# Patient Record
Sex: Male | Born: 1965 | Race: White | Hispanic: No | Marital: Married | Smoking: Current some day smoker
Health system: Southern US, Community
[De-identification: ages and names within clinical notes are randomized; demographics above are authoritative.]

## PROBLEM LIST (undated history)

## (undated) DIAGNOSIS — L732 Hidradenitis suppurativa: Secondary | ICD-10-CM

## (undated) DIAGNOSIS — E785 Hyperlipidemia, unspecified: Secondary | ICD-10-CM

## (undated) DIAGNOSIS — Z720 Tobacco use: Secondary | ICD-10-CM

## (undated) DIAGNOSIS — Z89619 Acquired absence of unspecified leg above knee: Secondary | ICD-10-CM

## (undated) DIAGNOSIS — I10 Essential (primary) hypertension: Secondary | ICD-10-CM

## (undated) DIAGNOSIS — Z951 Presence of aortocoronary bypass graft: Secondary | ICD-10-CM

## (undated) DIAGNOSIS — R9430 Abnormal result of cardiovascular function study, unspecified: Secondary | ICD-10-CM

## (undated) DIAGNOSIS — I2510 Atherosclerotic heart disease of native coronary artery without angina pectoris: Secondary | ICD-10-CM

## (undated) DIAGNOSIS — R0989 Other specified symptoms and signs involving the circulatory and respiratory systems: Secondary | ICD-10-CM

## (undated) DIAGNOSIS — I739 Peripheral vascular disease, unspecified: Secondary | ICD-10-CM

## (undated) HISTORY — DX: Hyperlipidemia, unspecified: E78.5

## (undated) HISTORY — DX: Abnormal result of cardiovascular function study, unspecified: R94.30

## (undated) HISTORY — DX: Tobacco use: Z72.0

## (undated) HISTORY — DX: Essential (primary) hypertension: I10

## (undated) HISTORY — DX: Other specified symptoms and signs involving the circulatory and respiratory systems: R09.89

## (undated) HISTORY — DX: Presence of aortocoronary bypass graft: Z95.1

## (undated) HISTORY — DX: Acquired absence of unspecified leg above knee: Z89.619

## (undated) HISTORY — DX: Hidradenitis suppurativa: L73.2

## (undated) HISTORY — DX: Peripheral vascular disease, unspecified: I73.9

## (undated) HISTORY — DX: Atherosclerotic heart disease of native coronary artery without angina pectoris: I25.10

## (undated) HISTORY — PX: ABOVE KNEE LEG AMPUTATION: SUR20

## (undated) HISTORY — PX: CORONARY ARTERY BYPASS GRAFT: SHX141

---

## 2001-06-14 ENCOUNTER — Encounter: Payer: Self-pay | Admitting: Internal Medicine

## 2001-06-14 ENCOUNTER — Emergency Department (HOSPITAL_COMMUNITY): Admission: EM | Admit: 2001-06-14 | Discharge: 2001-06-14 | Payer: Self-pay | Admitting: Internal Medicine

## 2001-06-19 ENCOUNTER — Emergency Department (HOSPITAL_COMMUNITY): Admission: EM | Admit: 2001-06-19 | Discharge: 2001-06-19 | Payer: Self-pay | Admitting: Emergency Medicine

## 2006-10-28 ENCOUNTER — Ambulatory Visit: Payer: Self-pay | Admitting: Cardiology

## 2006-10-28 ENCOUNTER — Encounter: Payer: Self-pay | Admitting: Cardiology

## 2006-10-28 ENCOUNTER — Inpatient Hospital Stay (HOSPITAL_COMMUNITY): Admission: EM | Admit: 2006-10-28 | Discharge: 2006-10-30 | Payer: Self-pay | Admitting: Cardiology

## 2006-11-14 ENCOUNTER — Ambulatory Visit: Payer: Self-pay | Admitting: Cardiology

## 2007-04-04 ENCOUNTER — Ambulatory Visit: Payer: Self-pay | Admitting: Cardiology

## 2007-12-03 ENCOUNTER — Encounter: Payer: Self-pay | Admitting: Cardiology

## 2007-12-09 ENCOUNTER — Ambulatory Visit: Payer: Self-pay | Admitting: Cardiology

## 2007-12-15 ENCOUNTER — Ambulatory Visit: Payer: Self-pay | Admitting: Cardiology

## 2007-12-25 ENCOUNTER — Encounter: Payer: Self-pay | Admitting: Cardiology

## 2007-12-29 ENCOUNTER — Ambulatory Visit: Payer: Self-pay | Admitting: Cardiology

## 2007-12-29 ENCOUNTER — Encounter: Payer: Self-pay | Admitting: Cardiology

## 2007-12-30 ENCOUNTER — Inpatient Hospital Stay (HOSPITAL_BASED_OUTPATIENT_CLINIC_OR_DEPARTMENT_OTHER): Admission: RE | Admit: 2007-12-30 | Discharge: 2007-12-30 | Payer: Self-pay | Admitting: Cardiology

## 2007-12-30 ENCOUNTER — Ambulatory Visit: Payer: Self-pay | Admitting: Cardiology

## 2008-01-09 ENCOUNTER — Ambulatory Visit: Payer: Self-pay | Admitting: Cardiology

## 2008-04-12 ENCOUNTER — Ambulatory Visit: Payer: Self-pay | Admitting: Cardiology

## 2008-09-20 ENCOUNTER — Encounter: Payer: Self-pay | Admitting: Cardiology

## 2008-09-21 ENCOUNTER — Ambulatory Visit: Payer: Self-pay | Admitting: Cardiology

## 2009-03-09 ENCOUNTER — Encounter: Payer: Self-pay | Admitting: Cardiology

## 2009-03-10 ENCOUNTER — Ambulatory Visit: Payer: Self-pay | Admitting: Cardiology

## 2009-03-21 ENCOUNTER — Telehealth (INDEPENDENT_AMBULATORY_CARE_PROVIDER_SITE_OTHER): Payer: Self-pay | Admitting: *Deleted

## 2009-09-30 ENCOUNTER — Encounter (INDEPENDENT_AMBULATORY_CARE_PROVIDER_SITE_OTHER): Payer: Self-pay | Admitting: Nurse Practitioner

## 2009-10-10 ENCOUNTER — Ambulatory Visit: Payer: Self-pay | Admitting: Cardiology

## 2009-10-14 ENCOUNTER — Ambulatory Visit: Payer: Self-pay | Admitting: Cardiology

## 2009-10-14 ENCOUNTER — Inpatient Hospital Stay (HOSPITAL_BASED_OUTPATIENT_CLINIC_OR_DEPARTMENT_OTHER): Admission: RE | Admit: 2009-10-14 | Discharge: 2009-10-14 | Payer: Self-pay | Admitting: Cardiology

## 2009-10-17 ENCOUNTER — Ambulatory Visit: Payer: Self-pay | Admitting: Thoracic Surgery (Cardiothoracic Vascular Surgery)

## 2009-10-17 ENCOUNTER — Encounter: Payer: Self-pay | Admitting: Cardiology

## 2009-10-24 ENCOUNTER — Encounter: Payer: Self-pay | Admitting: Physician Assistant

## 2009-10-24 ENCOUNTER — Ambulatory Visit: Payer: Self-pay | Admitting: Thoracic Surgery (Cardiothoracic Vascular Surgery)

## 2009-10-24 ENCOUNTER — Encounter: Payer: Self-pay | Admitting: Thoracic Surgery (Cardiothoracic Vascular Surgery)

## 2009-10-27 ENCOUNTER — Encounter: Payer: Self-pay | Admitting: Physician Assistant

## 2009-10-27 ENCOUNTER — Ambulatory Visit: Payer: Self-pay | Admitting: Thoracic Surgery (Cardiothoracic Vascular Surgery)

## 2009-10-27 ENCOUNTER — Encounter: Payer: Self-pay | Admitting: Thoracic Surgery (Cardiothoracic Vascular Surgery)

## 2009-10-27 ENCOUNTER — Inpatient Hospital Stay (HOSPITAL_COMMUNITY)
Admission: RE | Admit: 2009-10-27 | Discharge: 2009-11-01 | Payer: Self-pay | Admitting: Thoracic Surgery (Cardiothoracic Vascular Surgery)

## 2009-10-28 ENCOUNTER — Encounter: Payer: Self-pay | Admitting: Physician Assistant

## 2009-10-31 ENCOUNTER — Encounter: Payer: Self-pay | Admitting: Physician Assistant

## 2009-11-01 ENCOUNTER — Encounter: Payer: Self-pay | Admitting: Physician Assistant

## 2009-11-10 ENCOUNTER — Telehealth: Payer: Self-pay | Admitting: Cardiology

## 2009-11-21 ENCOUNTER — Encounter: Payer: Self-pay | Admitting: Physician Assistant

## 2009-11-21 ENCOUNTER — Ambulatory Visit: Payer: Self-pay | Admitting: Thoracic Surgery (Cardiothoracic Vascular Surgery)

## 2009-11-21 ENCOUNTER — Encounter
Admission: RE | Admit: 2009-11-21 | Discharge: 2009-11-21 | Payer: Self-pay | Admitting: Thoracic Surgery (Cardiothoracic Vascular Surgery)

## 2009-11-28 ENCOUNTER — Ambulatory Visit: Payer: Self-pay | Admitting: Physician Assistant

## 2009-12-22 ENCOUNTER — Telehealth: Payer: Self-pay | Admitting: Physician Assistant

## 2009-12-23 ENCOUNTER — Ambulatory Visit: Payer: Self-pay | Admitting: Physician Assistant

## 2009-12-30 ENCOUNTER — Encounter: Payer: Self-pay | Admitting: Physician Assistant

## 2009-12-30 ENCOUNTER — Inpatient Hospital Stay (HOSPITAL_COMMUNITY)
Admission: EM | Admit: 2009-12-30 | Discharge: 2010-01-05 | Payer: Self-pay | Source: Home / Self Care | Attending: Cardiology | Admitting: Cardiology

## 2010-01-04 ENCOUNTER — Encounter: Payer: Self-pay | Admitting: Physician Assistant

## 2010-01-04 LAB — BASIC METABOLIC PANEL
BUN: 12 mg/dL (ref 6–23)
CO2: 27 mEq/L (ref 19–32)
Calcium: 9 mg/dL (ref 8.4–10.5)
Chloride: 106 mEq/L (ref 96–112)
Creatinine, Ser: 0.93 mg/dL (ref 0.4–1.5)
GFR calc Af Amer: >60 mL/min (ref >60)
GFR calc non Af Amer: >60 mL/min (ref >60)
Glucose, Bld: 114 mg/dL — ABNORMAL HIGH (ref 70–99)
Potassium: 4.1 mEq/L (ref 3.5–5.1)
Sodium: 138 mEq/L (ref 135–145)

## 2010-01-05 ENCOUNTER — Encounter: Payer: Self-pay | Admitting: Physician Assistant

## 2010-01-05 LAB — CBC
HCT: 46.4 % (ref 39.0–52.0)
Hemoglobin: 15.4 g/dL (ref 13.0–17.0)
MCH: 32 pg (ref 26.0–34.0)
MCHC: 33.2 g/dL (ref 30.0–36.0)
MCV: 96.3 fL (ref 78.0–100.0)
Platelets: 167 10*3/uL (ref 150–400)
RBC: 4.82 MIL/uL (ref 4.22–5.81)
RDW: 13.3 % (ref 11.5–15.5)
WBC: 5.7 10*3/uL (ref 4.0–10.5)

## 2010-01-05 LAB — BASIC METABOLIC PANEL
BUN: 10 mg/dL (ref 6–23)
CO2: 26 mEq/L (ref 19–32)
Calcium: 8.7 mg/dL (ref 8.4–10.5)
Chloride: 108 mEq/L (ref 96–112)
Creatinine, Ser: 1.01 mg/dL (ref 0.4–1.5)
GFR calc Af Amer: >60 mL/min (ref >60)
GFR calc non Af Amer: >60 mL/min (ref >60)
Glucose, Bld: 135 mg/dL — ABNORMAL HIGH (ref 70–99)
Potassium: 4.1 mEq/L (ref 3.5–5.1)
Sodium: 138 mEq/L (ref 135–145)

## 2010-01-25 ENCOUNTER — Encounter: Payer: Self-pay | Admitting: Physician Assistant

## 2010-01-25 ENCOUNTER — Ambulatory Visit
Admission: RE | Admit: 2010-01-25 | Discharge: 2010-01-25 | Payer: Self-pay | Source: Home / Self Care | Attending: Physician Assistant | Admitting: Physician Assistant

## 2010-01-27 NOTE — Procedures (Signed)
NAME:  [name], [name]                ACCOUNT NO.:  [id number]  MEDICAL RECORD NO.:  [id number]           PATIENT TYPE:  LOCATION:                                 FACILITY:  PHYSICIAN:  Lorine Bears, MD     DATE OF BIRTH:  1965/08/11  DATE OF PROCEDURE: DATE OF DISCHARGE:                           CARDIAC CATHETERIZATION   PROCEDURES PERFORMED: 1. Left heart catheterization. 2. Coronary angiography. 3. Left ventricular angiography. 4. LIMA angiography. 5. SVG angiography. 6. OM-2 angioplasty and drug-eluting stent placement.  INDICATIONS AND CLINICAL HISTORY:  [name] is a 45 year old gentleman with known history of coronary artery disease status post previous angioplasty and stent placement to the mid circumflex.  He ultimately underwent coronary artery bypass graft surgery in October 2011.  He presented to the hospital with recurrent symptoms of chest and arm discomfort similar to his previous angina.  He ruled out for myocardial infarction.  Due to his previous history of coronary artery disease and continued symptoms, coronary angiography was recommended.  Risks, benefits, and alternatives were discussed with the patient.  STUDY DETAILS:  Standard informed consent was obtained.  The left groin area was prepped in a sterile fashion.  A 5-French sheath was placed in the left femoral artery.  The femoral pulse was weak overall.  A coronary angiography was performed with a JL-4, JR-4, and a pigtail catheter.  I long exchange wire and LIMA catheters were used to perform LIMA angiography.  Left ventricular angiography was performed in the RAO 30 position.  At the end of the procedure, it was decided with an interventional procedure to OM-2.  INTERVENTIONAL PROCEDURE:  The 5-French sheath was exchanged to a 6- [place] sheath.  An XB 3.5 guiding catheter with side holes was used. The intuition wire crossed the lesion easily.  Predilation was performed with 2.25- x 12-mm Apex  balloon to 8 atmospheres and then 10 atmospheres.  I then placed a 2.5- x 20-mm Promus Element stent and deployed at 11 atmospheres.  This was postdilated proximally with a 3.0- x 12-mm noncompliant balloon to 12 atmospheres.  Final angiography showed very good results.  The wire and catheters were removed.  Sheath was sutured in place.  The patient tolerated the procedure well with no immediate complications.  STUDY FINDINGS: 1. Hemodynamic findings:  Aortic pressure was 141/27 with a mean     pressure of 95 mmHg.  Left ventricular pressure was 147/14 with a     left ventricular end-diastolic pressure of 22 mmHg. 2. Left ventricular angiography:  This showed an ejection fraction of     50%. 3. Coronary angiography:     a.     Left main coronary artery:  The vessel was normal in size      and free of significant disease.     b.     Left circumflex artery:  There was a 40% diffuse ostial      stenosis.  It gives a very small OM-1.  In the mid segment right      after giving OM-2, there was a 70% stenosis proximal to previously  placed stent.  This supplies in the OM territory which is supplied      by a patent graft.  OM-2 has a 70% proximal stenosis and overall      is medium-sized vessel.     c.     Left anterior descending artery:  The vessel was normal in      size.  It was occluded in the mid segment after giving first      diagonal as well as septal branch.  The first diagonal has mild      atherosclerosis with no obstructive lesions.     d.     Right coronary artery:  The vessel was occluded in the mid      segment after giving a large-sized RV branch.     e.     SVG to right PDA:  The graft was patent with 30% to 40%      proximal stenosis but no obstructive disease overall.  It connects      to PDA which has some diffuse nonobstructive disease.  The PDA in      the proximal segment is diffusely diseased, and thus there is      probably limited flow to the posterolateral  branches which are      overall medium in size.     f.     SVG to second diagonal:  The graft was patent.  There was      20% ostial stenosis and 30% proximal stenosis.  Overall, quality      of the graft is good.     g.     SVG to OM-3 is patent with 20% ostial stenosis and no other      obstructive disease.     h.     LIMA to LAD is patent with 10% to 20% anastomosis lesion.      The LAD is normal after the anastomosis.  IMPRESSION: 1. Severe native three-vessel coronary artery disease with patent     grafts. 2. Low normal left ventricular systolic function. 3. Significant disease in the proximal obtuse marginal-2, which was     not bypassed. 4. Successful angioplasty and drug-eluting stent placement to the     obtuse marginal-2 with a 2.5- x 20-mm Promus Element stent. 5. There is significant disease in proximal posterior descending     artery and thus the posterolateral branches likely have compromised     flow.  However, angioplasty in that location through the SVG is technically difficult and      not recommended at this time.   RECOMMENDATIONS: 1. Continue aspirin daily indefinitely and Plavix 75 mg once daily for     at least 12 months. 2. Continue aggressive medical therapy.     Lorine Bears, MD   ______________________________ Lorine Bears, MD    MA/MEDQ  D:  01/04/2010  T:  01/05/2010  Job:  [id number]  Electronically Signed by Lorine Bears MD on 01/25/2010 10:28:07 AM
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
## 2010-01-31 NOTE — Letter (Signed)
Summary: [facility]   [facility]   Imported By: Roderic Ovens 11/09/2009 16:19:50  _____________________________________________________________________  External Attachment:    Type:   Image     Comment:   External Document
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
## 2010-01-31 NOTE — Letter (Signed)
Summary: Internal Correspondence/ FAXED PRE-CATH ORDER  Internal Correspondence/ FAXED PRE-CATH ORDER   Imported By: Dorise Hiss 10/24/2009 15:30:32  _____________________________________________________________________  External Attachment:    Type:   Image     Comment:   External Document

## 2010-01-31 NOTE — Miscellaneous (Signed)
Summary: Rehab Report/ FAXED  CARDIAC REHAB REFERRAL  Rehab Report/ FAXED  CARDIAC REHAB REFERRAL   Imported By: Dorise Hiss 12/06/2009 15:43:34  _____________________________________________________________________  External Attachment:    Type:   Image     Comment:   External Document

## 2010-01-31 NOTE — Progress Notes (Signed)
Summary: wants something for stress  Phone Note Call from Patient Call back at [phone]   Summary of Call: Pt called asking for something for stress. He states Hospice has been called in on his mother and his wife is at [facility] having hip surgery. Notified pt his primary MD is the more appropriate provider for stress medications. Pt verbalized understanding.  Initial call taken by: Cyril Loosen, RN, BSN,  March 21, 2009 4:36 PM

## 2010-01-31 NOTE — Letter (Signed)
Summary: Discharge Summary  Discharge Summary   Imported By: Claudette Laws 11/02/2009 12:36:45  _____________________________________________________________________  External Attachment:    Type:   Image     Comment:   External Document

## 2010-01-31 NOTE — Progress Notes (Signed)
Summary: medication question  Phone Note Other Incoming   Caller: Lisbeth Renshaw [phone] opt [phone] Summary of Call: Calling regarding medication Statin Initial call taken by: Judie Grieve,  November 10, 2009 10:51 AM  Follow-up for Phone Call        Attempted to return call. Initially was told they could not find why we were called. Then [employer] employee asked if pt was present to give permission for me to speak with her. When I notified her that I was returning their call, she stated "I understand that but I mean anybody could call." Notified her that pt was not present and that I was simply trying to return their call. They can call back if they need anything further. Follow-up by: Cyril Loosen, RN, BSN,  November 10, 2009 11:41 AM
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
## 2010-02-02 NOTE — Medication Information (Signed)
Summary: [facility]  [facility] [employer] [facility]   Imported By: Arta Bruce 12/14/2009 09:24:11  _____________________________________________________________________  External Attachment:    Type:   Image     Comment:   External Document

## 2010-02-02 NOTE — Progress Notes (Signed)
Summary: Shoulder/Arm Pain w/Chest Burning  Phone Note Call from Patient Call back at Home Phone [phone]   Summary of Call: Pt's wife called stating for about 1 week pt is having (L) shoulder pain that goes down his arm. He has burning in his chest when he has the arm pain. She states the pain is not with exertion alone and it comes and goes. Pt has NTG but hasn't taken this b/c he was told following CABG that he wouldn't need it anymore.  Discussed with Gene Serpe, PA-C. Pt will come into the office in the morning for EKG and VS. Pt's wife will notify him of this plan and let us know if he is unable to come in the morning.  Initial call taken by: Cyril Loosen, RN, BSN,  December 22, 2009 4:26 PM     Appended Document: Shoulder/Arm Pain w/Chest Burning Pt did not come for office visit and did not call office to as requested. Pt has no visits showing in ER at [facility] or [facility]. Attempted to reach pt to check on his status but no answer/voicemail.   Appended Document: Shoulder/Arm Pain w/Chest Burning Pt's wife called stating pt would like to be seen. She states he didn't come last week b/c he was hoping the pain would get better. She states per pt, pt is actually worse than it was last week. Pt's wife notified pt should go to ER for evaluation of worsening chest pain. Pt's wife verbalized understanding and states she will have pt go to ER for eval today.

## 2010-02-02 NOTE — Cardiovascular Report (Signed)
Summary: Cardiac Catheterization  Cardiac Catheterization   Imported By: Dorise Hiss 01/24/2010 14:30:37  _____________________________________________________________________  External Attachment:    Type:   Image     Comment:   External Document

## 2010-02-03 ENCOUNTER — Encounter: Payer: Self-pay | Admitting: Physician Assistant

## 2010-02-07 ENCOUNTER — Encounter (INDEPENDENT_AMBULATORY_CARE_PROVIDER_SITE_OTHER): Payer: Self-pay | Admitting: *Deleted

## 2010-02-16 NOTE — Letter (Signed)
Summary: Engineer, materials at [facility]  [address]   [place], [place] [address]   Phone: [phone]  Fax: [phone]        February 07, 2010 MRN: [id number]    [facility] [address] [place], [place]  [address]    Dear Mr. Babic,  Your test ordered by Selena Batten has been reviewed by your physician (or physician assistant) and was found to be normal or stable. Your physician (or physician assistant) felt no changes were needed at this time.  ____ Echocardiogram  ____ Cardiac Stress Test  __X__ Lab Work  ____ Peripheral vascular study of arms, legs or neck  ____ CT scan or X-ray  ____ Lung or Breathing test  ____ Other:   Thank you.   Cyril Loosen, RN, BSN    Duane Boston, M.D., F.A.C.C. Thressa Sheller, M.D., F.A.C.C. Oneal Grout, M.D., F.A.C.C. Cheree Ditto, M.D., F.A.C.C. Daiva Nakayama, M.D., F.A.C.C. Kenney Houseman, M.D., F.A.C.C. Jeanne Ivan, PA-C

## 2010-03-13 LAB — BASIC METABOLIC PANEL
BUN: 11 mg/dL (ref 6–23)
CO2: 27 mEq/L (ref 19–32)
Calcium: 9 mg/dL (ref 8.4–10.5)
Calcium: 9.6 mg/dL (ref 8.4–10.5)
Chloride: 106 mEq/L (ref 96–112)
Creatinine, Ser: 0.85 mg/dL (ref 0.4–1.5)
Creatinine, Ser: 0.99 mg/dL (ref 0.4–1.5)
GFR calc Af Amer: >60 mL/min (ref >60)
GFR calc Af Amer: >60 mL/min (ref >60)
GFR calc non Af Amer: >60 mL/min (ref >60)
GFR calc non Af Amer: >60 mL/min (ref >60)
Glucose, Bld: 159 mg/dL — ABNORMAL HIGH (ref 70–99)
Potassium: 3.7 mEq/L (ref 3.5–5.1)
Sodium: 138 mEq/L (ref 135–145)
Sodium: 140 mEq/L (ref 135–145)

## 2010-03-13 LAB — CARDIAC PANEL(CRET KIN+CKTOT+MB+TROPI)
CK, MB: 0.6 ng/mL (ref 0.3–4.0)
CK, MB: 0.6 ng/mL (ref 0.3–4.0)
Relative Index: INVALID (ref 0.0–2.5)
Total CK: 31 U/L (ref 7–232)
Troponin I: 0.02 ng/mL (ref 0.00–0.06)
Troponin I: 0.03 ng/mL (ref 0.00–0.06)

## 2010-03-13 LAB — DIFFERENTIAL
Lymphocytes Relative: 31 % (ref 12–46)
Lymphs Abs: 1.7 10*3/uL (ref 0.7–4.0)
Monocytes Absolute: 0.4 10*3/uL (ref 0.1–1.0)
Monocytes Relative: 7 % (ref 3–12)
Neutro Abs: 3.3 10*3/uL (ref 1.7–7.7)

## 2010-03-13 LAB — CBC
HCT: 46 % (ref 39.0–52.0)
Hemoglobin: 15.1 g/dL (ref 13.0–17.0)
Hemoglobin: 15.8 g/dL (ref 13.0–17.0)
MCH: 32.4 pg (ref 26.0–34.0)
MCHC: 33.2 g/dL (ref 30.0–36.0)
Platelets: 194 10*3/uL (ref 150–400)
RBC: 4.88 MIL/uL (ref 4.22–5.81)
RDW: 13.3 % (ref 11.5–15.5)
WBC: 5.5 10*3/uL (ref 4.0–10.5)

## 2010-03-13 LAB — POCT CARDIAC MARKERS
CKMB, poc: <1 ng/mL — ABNORMAL LOW (ref 1.0–8.0)
Troponin i, poc: <0.05 ng/mL (ref 0.00–0.09)
Troponin i, poc: <0.05 ng/mL (ref 0.00–0.09)

## 2010-03-13 LAB — CK TOTAL AND CKMB (NOT AT ARMC)
CK, MB: 0.6 ng/mL (ref 0.3–4.0)
CK, MB: 0.7 ng/mL (ref 0.3–4.0)
Relative Index: INVALID (ref 0.0–2.5)
Total CK: 22 U/L (ref 7–232)

## 2010-03-13 LAB — LIPID PANEL
Cholesterol: 201 mg/dL — ABNORMAL HIGH (ref 0–200)
Total CHOL/HDL Ratio: 6.9 RATIO

## 2010-03-13 LAB — BRAIN NATRIURETIC PEPTIDE: Pro B Natriuretic peptide (BNP): 84 pg/mL (ref 0.0–100.0)

## 2010-03-13 LAB — TROPONIN I
Troponin I: 0.03 ng/mL (ref 0.00–0.06)
Troponin I: 0.03 ng/mL (ref 0.00–0.06)

## 2010-03-13 LAB — MAGNESIUM: Magnesium: 2 mg/dL (ref 1.5–2.5)

## 2010-03-15 LAB — CBC
HCT: 40.7 % (ref 39.0–52.0)
HCT: 40.8 % (ref 39.0–52.0)
HCT: 41.9 % (ref 39.0–52.0)
HCT: 42.5 % (ref 39.0–52.0)
HCT: 43.2 % (ref 39.0–52.0)
HCT: 43.4 % (ref 39.0–52.0)
Hemoglobin: 13.5 g/dL (ref 13.0–17.0)
Hemoglobin: 13.5 g/dL (ref 13.0–17.0)
Hemoglobin: 13.5 g/dL (ref 13.0–17.0)
Hemoglobin: 13.9 g/dL (ref 13.0–17.0)
Hemoglobin: 14.3 g/dL (ref 13.0–17.0)
Hemoglobin: 14.4 g/dL (ref 13.0–17.0)
Hemoglobin: 14.7 g/dL (ref 13.0–17.0)
MCH: 32 pg (ref 26.0–34.0)
MCH: 32 pg (ref 26.0–34.0)
MCH: 32.5 pg (ref 26.0–34.0)
MCH: 32.7 pg (ref 26.0–34.0)
MCH: 33 pg (ref 26.0–34.0)
MCHC: 32.2 g/dL (ref 30.0–36.0)
MCHC: 32.7 g/dL (ref 30.0–36.0)
MCHC: 32.9 g/dL (ref 30.0–36.0)
MCHC: 33 g/dL (ref 30.0–36.0)
MCHC: 33.1 g/dL (ref 30.0–36.0)
MCHC: 33.2 g/dL (ref 30.0–36.0)
MCHC: 33.3 g/dL (ref 30.0–36.0)
MCHC: 33.4 g/dL (ref 30.0–36.0)
MCHC: 34.5 g/dL (ref 30.0–36.0)
MCV: 95 fL (ref 78.0–100.0)
MCV: 97.7 fL (ref 78.0–100.0)
MCV: 98.3 fL (ref 78.0–100.0)
MCV: 98.5 fL (ref 78.0–100.0)
MCV: 99.3 fL (ref 78.0–100.0)
Platelets: 106 10*3/uL — ABNORMAL LOW (ref 150–400)
Platelets: 108 10*3/uL — ABNORMAL LOW (ref 150–400)
Platelets: 111 10*3/uL — ABNORMAL LOW (ref 150–400)
Platelets: 128 10*3/uL — ABNORMAL LOW (ref 150–400)
Platelets: 173 10*3/uL (ref 150–400)
Platelets: 98 10*3/uL — ABNORMAL LOW (ref 150–400)
RBC: 4.13 MIL/uL — ABNORMAL LOW (ref 4.22–5.81)
RBC: 4.15 MIL/uL — ABNORMAL LOW (ref 4.22–5.81)
RBC: 4.22 MIL/uL (ref 4.22–5.81)
RBC: 4.35 MIL/uL (ref 4.22–5.81)
RBC: 4.36 MIL/uL (ref 4.22–5.81)
RDW: 12.3 % (ref 11.5–15.5)
RDW: 12.5 % (ref 11.5–15.5)
RDW: 12.5 % (ref 11.5–15.5)
RDW: 12.6 % (ref 11.5–15.5)
RDW: 12.6 % (ref 11.5–15.5)
RDW: 12.7 % (ref 11.5–15.5)
RDW: 12.8 % (ref 11.5–15.5)
WBC: 11 10*3/uL — ABNORMAL HIGH (ref 4.0–10.5)
WBC: 11.8 10*3/uL — ABNORMAL HIGH (ref 4.0–10.5)
WBC: 11.9 10*3/uL — ABNORMAL HIGH (ref 4.0–10.5)
WBC: 6.5 10*3/uL (ref 4.0–10.5)
WBC: 6.7 10*3/uL (ref 4.0–10.5)
WBC: 8.3 10*3/uL (ref 4.0–10.5)

## 2010-03-15 LAB — BASIC METABOLIC PANEL
BUN: 10 mg/dL (ref 6–23)
BUN: 13 mg/dL (ref 6–23)
BUN: 15 mg/dL (ref 6–23)
BUN: 19 mg/dL (ref 6–23)
BUN: 22 mg/dL (ref 6–23)
CO2: 27 mEq/L (ref 19–32)
CO2: 27 mEq/L (ref 19–32)
CO2: 29 mEq/L (ref 19–32)
CO2: 31 mEq/L (ref 19–32)
Calcium: 8.1 mg/dL — ABNORMAL LOW (ref 8.4–10.5)
Calcium: 8.3 mg/dL — ABNORMAL LOW (ref 8.4–10.5)
Calcium: 8.5 mg/dL (ref 8.4–10.5)
Calcium: 8.6 mg/dL (ref 8.4–10.5)
Calcium: 8.6 mg/dL (ref 8.4–10.5)
Chloride: 100 mEq/L (ref 96–112)
Chloride: 101 mEq/L (ref 96–112)
Chloride: 101 mEq/L (ref 96–112)
Chloride: 103 mEq/L (ref 96–112)
Creatinine, Ser: 0.73 mg/dL (ref 0.4–1.5)
Creatinine, Ser: 0.78 mg/dL (ref 0.4–1.5)
Creatinine, Ser: 0.82 mg/dL (ref 0.4–1.5)
Creatinine, Ser: 0.86 mg/dL (ref 0.4–1.5)
Creatinine, Ser: 1 mg/dL (ref 0.4–1.5)
GFR calc Af Amer: >60 mL/min (ref >60)
GFR calc Af Amer: >60 mL/min (ref >60)
GFR calc Af Amer: >60 mL/min (ref >60)
GFR calc Af Amer: >60 mL/min (ref >60)
GFR calc non Af Amer: >60 mL/min (ref >60)
GFR calc non Af Amer: >60 mL/min (ref >60)
GFR calc non Af Amer: >60 mL/min (ref >60)
GFR calc non Af Amer: >60 mL/min (ref >60)
GFR calc non Af Amer: >60 mL/min (ref >60)
Glucose, Bld: 119 mg/dL — ABNORMAL HIGH (ref 70–99)
Glucose, Bld: 120 mg/dL — ABNORMAL HIGH (ref 70–99)
Glucose, Bld: 124 mg/dL — ABNORMAL HIGH (ref 70–99)
Glucose, Bld: 129 mg/dL — ABNORMAL HIGH (ref 70–99)
Glucose, Bld: 162 mg/dL — ABNORMAL HIGH (ref 70–99)
Potassium: 3.5 mEq/L (ref 3.5–5.1)
Potassium: 3.7 mEq/L (ref 3.5–5.1)
Potassium: 3.8 mEq/L (ref 3.5–5.1)
Potassium: 3.9 mEq/L (ref 3.5–5.1)
Sodium: 133 mEq/L — ABNORMAL LOW (ref 135–145)
Sodium: 134 mEq/L — ABNORMAL LOW (ref 135–145)
Sodium: 137 mEq/L (ref 135–145)
Sodium: 137 mEq/L (ref 135–145)
Sodium: 138 mEq/L (ref 135–145)

## 2010-03-15 LAB — URINALYSIS, ROUTINE W REFLEX MICROSCOPIC
Glucose, UA: NEGATIVE mg/dL
Hgb urine dipstick: NEGATIVE
Ketones, ur: NEGATIVE mg/dL
Protein, ur: NEGATIVE mg/dL
Urobilinogen, UA: 0.2 mg/dL (ref 0.0–1.0)

## 2010-03-15 LAB — POCT I-STAT, CHEM 8
Calcium, Ion: 1.14 mmol/L (ref 1.12–1.32)
Chloride: 100 mEq/L (ref 96–112)
Creatinine, Ser: 0.8 mg/dL (ref 0.4–1.5)
Creatinine, Ser: 1.1 mg/dL (ref 0.4–1.5)
Glucose, Bld: 160 mg/dL — ABNORMAL HIGH (ref 70–99)
Glucose, Bld: 95 mg/dL (ref 70–99)
HCT: 44 % (ref 39.0–52.0)
HCT: 46 % (ref 39.0–52.0)
Hemoglobin: 15 g/dL (ref 13.0–17.0)
Hemoglobin: 15.6 g/dL (ref 13.0–17.0)
Potassium: 3.8 mEq/L (ref 3.5–5.1)
Potassium: 4.4 mEq/L (ref 3.5–5.1)
Sodium: 140 mEq/L (ref 135–145)

## 2010-03-15 LAB — PROTIME-INR
INR: 0.98 (ref 0.00–1.49)
INR: 1.25 (ref 0.00–1.49)
Prothrombin Time: 13.2 seconds (ref 11.6–15.2)
Prothrombin Time: 15.9 seconds — ABNORMAL HIGH (ref 11.6–15.2)

## 2010-03-15 LAB — GLUCOSE, CAPILLARY
Glucose-Capillary: 115 mg/dL — ABNORMAL HIGH (ref 70–99)
Glucose-Capillary: 115 mg/dL — ABNORMAL HIGH (ref 70–99)
Glucose-Capillary: 119 mg/dL — ABNORMAL HIGH (ref 70–99)
Glucose-Capillary: 127 mg/dL — ABNORMAL HIGH (ref 70–99)
Glucose-Capillary: 127 mg/dL — ABNORMAL HIGH (ref 70–99)
Glucose-Capillary: 128 mg/dL — ABNORMAL HIGH (ref 70–99)
Glucose-Capillary: 130 mg/dL — ABNORMAL HIGH (ref 70–99)
Glucose-Capillary: 131 mg/dL — ABNORMAL HIGH (ref 70–99)
Glucose-Capillary: 139 mg/dL — ABNORMAL HIGH (ref 70–99)
Glucose-Capillary: 140 mg/dL — ABNORMAL HIGH (ref 70–99)
Glucose-Capillary: 150 mg/dL — ABNORMAL HIGH (ref 70–99)
Glucose-Capillary: 159 mg/dL — ABNORMAL HIGH (ref 70–99)
Glucose-Capillary: 168 mg/dL — ABNORMAL HIGH (ref 70–99)

## 2010-03-15 LAB — SURGICAL PCR SCREEN
MRSA, PCR: NEGATIVE
Staphylococcus aureus: NEGATIVE

## 2010-03-15 LAB — POCT I-STAT 3, ART BLOOD GAS (G3+)
Acid-base deficit: 4 mmol/L — ABNORMAL HIGH (ref 0.0–2.0)
Acid-base deficit: 5 mmol/L — ABNORMAL HIGH (ref 0.0–2.0)
Acid-base deficit: 6 mmol/L — ABNORMAL HIGH (ref 0.0–2.0)
Bicarbonate: 20.8 mEq/L (ref 20.0–24.0)
Bicarbonate: 22.8 mEq/L (ref 20.0–24.0)
O2 Saturation: 94 %
Patient temperature: 36
Patient temperature: 37.2
Patient temperature: 38
TCO2: 22 mmol/L (ref 0–100)
TCO2: 24 mmol/L (ref 0–100)
TCO2: 24 mmol/L (ref 0–100)
pCO2 arterial: 44.2 mmHg (ref 35.0–45.0)
pH, Arterial: 7.321 — ABNORMAL LOW (ref 7.350–7.450)
pH, Arterial: 7.329 — ABNORMAL LOW (ref 7.350–7.450)
pO2, Arterial: 266 mmHg — ABNORMAL HIGH (ref 80.0–100.0)
pO2, Arterial: 75 mmHg — ABNORMAL LOW (ref 80.0–100.0)

## 2010-03-15 LAB — HEMOGLOBIN AND HEMATOCRIT, BLOOD
HCT: 36.9 % — ABNORMAL LOW (ref 39.0–52.0)
Hemoglobin: 12.3 g/dL — ABNORMAL LOW (ref 13.0–17.0)

## 2010-03-15 LAB — POCT I-STAT 4, (NA,K, GLUC, HGB,HCT)
Glucose, Bld: 126 mg/dL — ABNORMAL HIGH (ref 70–99)
HCT: 36 % — ABNORMAL LOW (ref 39.0–52.0)
HCT: 44 % (ref 39.0–52.0)
Hemoglobin: 11.9 g/dL — ABNORMAL LOW (ref 13.0–17.0)
Hemoglobin: 12.2 g/dL — ABNORMAL LOW (ref 13.0–17.0)
Potassium: 4 mEq/L (ref 3.5–5.1)
Potassium: 4.4 mEq/L (ref 3.5–5.1)
Potassium: 4.4 mEq/L (ref 3.5–5.1)
Sodium: 136 mEq/L (ref 135–145)
Sodium: 138 mEq/L (ref 135–145)

## 2010-03-15 LAB — POCT I-STAT GLUCOSE
Glucose, Bld: 129 mg/dL — ABNORMAL HIGH (ref 70–99)
Operator id: 238831

## 2010-03-15 LAB — COMPREHENSIVE METABOLIC PANEL
Albumin: 4 g/dL (ref 3.5–5.2)
Alkaline Phosphatase: 90 U/L (ref 39–117)
BUN: 15 mg/dL (ref 6–23)
Calcium: 9.7 mg/dL (ref 8.4–10.5)
Glucose, Bld: 117 mg/dL — ABNORMAL HIGH (ref 70–99)
Potassium: 4.1 mEq/L (ref 3.5–5.1)
Sodium: 136 mEq/L (ref 135–145)
Total Protein: 7.7 g/dL (ref 6.0–8.3)

## 2010-03-15 LAB — BLOOD GAS, ARTERIAL
Drawn by: 206361
Patient temperature: 98.6
pCO2 arterial: 36.7 mmHg (ref 35.0–45.0)
pH, Arterial: 7.414 (ref 7.350–7.450)

## 2010-03-15 LAB — TYPE AND SCREEN: Antibody Screen: NEGATIVE

## 2010-03-15 LAB — CREATININE, SERUM
Creatinine, Ser: 0.85 mg/dL (ref 0.4–1.5)
Creatinine, Ser: 1.02 mg/dL (ref 0.4–1.5)

## 2010-03-15 LAB — MAGNESIUM
Magnesium: 2.3 mg/dL (ref 1.5–2.5)
Magnesium: 2.4 mg/dL (ref 1.5–2.5)

## 2010-03-15 LAB — PLATELET COUNT: Platelets: 146 10*3/uL — ABNORMAL LOW (ref 150–400)

## 2010-03-15 LAB — URINE MICROSCOPIC-ADD ON

## 2010-03-15 LAB — HEMOGLOBIN A1C
Hgb A1c MFr Bld: 5.5 % (ref <5.7)
Mean Plasma Glucose: 111 mg/dL (ref <117)

## 2010-03-15 LAB — ABO/RH: ABO/RH(D): A POS

## 2010-05-04 ENCOUNTER — Other Ambulatory Visit: Payer: Self-pay | Admitting: Cardiology
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
## 2010-05-16 NOTE — Assessment & Plan Note (Signed)
OFFICE VISIT   [name], [name]  DOB:  1965-12-27                                        October 24, 2009  CHART #:  [id number]   HISTORY OF PRESENT ILLNESS:  The patient returns for followup with plans  to proceed with coronary artery bypass grafting tomorrow.  He was  originally seen in consultation last week.  He has been off of Plavix  since last week.  He underwent routine preoperative screening today.  He  does note that he developed a new boil located just above his left hip  on his back related to his chronic skin infection.  He has not had any  fevers or chills.  His preoperative lab work are pending at this time.  He otherwise feels well, but he does report that he has continued to  require nitroglycerin on a frequent basis for recurrent angina pectoris.   PHYSICAL EXAMINATION:  Notable for well-appearing male with blood  pressure 145/82, pulse 61, and oxygen saturation 97%.  He is afebrile.  Auscultation of the chest reveals clear breath sounds.  The anterior  chest is clear and there is no sign of skin infection there.  He does  have a boil that has spontaneously drained just above his hip on the  left back.  There is some mild surrounding cellulitis.  No other areas  of concern are noted.  There is one area that is healing on his buttocks  and no other active skin lesions are noted.  The remainder of his  physical exam is unchanged from previously.   IMPRESSION:  Severe three-vessel coronary artery disease with stable  angina pectoris.  The patient needs coronary artery bypass grafting.  He  has chronic skin infection with a single boil on his left back, it is  typical for his chronic problem.  Options include delayed surgery for a  preoperative course of antibiotics versus starting antibiotics and  proceeding with surgery.  I have discussed matters over the telephone  with his dermatologist, Dr. Philis Kendall at [facility].  She is  recommended that we stop his Enbrel at the time of surgery and treat him  with a course of oral doxycycline.  We will go ahead and start  doxycycline today and plan with surgery on Wednesday as previously  scheduled.  All of his questions have been addressed.  The  patient understands that he may be at slightly increased risk for  postoperative wound infection.  All of his questions have been answered.   Salvatore Decent. Cornelius Moras, M.D.  Electronically Signed   CHO/MEDQ  D:  10/24/2009  T:  10/25/2009  Job:  [id number]
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
## 2010-05-16 NOTE — Discharge Summary (Signed)
NAMEBRENDON, [name]                ACCOUNT NO.:  [id number]   MEDICAL RECORD NO.:  [id number]          PATIENT TYPE:  INP   LOCATION:  2926                         FACILITY:  [facility]   PHYSICIAN:  Veverly Fells. Excell Seltzer, MD  DATE OF BIRTH:  February 10, 1965   DATE OF ADMISSION:  10/28/2006  DATE OF DISCHARGE:  10/30/2006                         DISCHARGE SUMMARY - REFERRING   DISCHARGE DIAGNOSES:  1. Acute inferolateral myocardial infarction with transfer from      [facility].  2. Three-vessel coronary artery disease.  3. Drug-eluting stent x2 to the circumflex with residual disease      treated by medical therapy.  4. Hypertension.  5. Asymptomatic nonsustained ventricular tachycardia.  6. Tobacco use.  7. Hyperlipidemia.  8. History as noted below.   PROCEDURE PERFORMED:  Emergent cardiac catheterization on October 28, 2006, with two drug-eluting stents placed at the circumflex by Dr.  Juanda Chance.   SUMMARY OF HISTORY:  [name] is a 45 year old white male who was  transferred from [facility] for emergently due to EKG changes  consistent with inferolateral myocardial infarction and severe chest  discomfort.  His risk factors include tobacco use and family history of  premature coronary artery disease.   LABORATORY DATA:  At [facility], initial CK-MB was  1820 and 143.5 with a relative index of 7.9 and a troponin of 47.91.  Subsequent CK, MB, relative indexes and troponins were declining.  On  October 29, 2006, H&H was 16.1 and 46.4, normal indices, platelets 253,  WBCs 11.2.  At the time of discharge, sodium was 135, potassium 4, BUN  8, creatinine 0.88, glucose 100.  Hemoglobin A1c was 5.6.  TSH was  0.838.   EKG on October 30, 2006, prior to discharge, shows normal sinus rhythm,  left axis deviation, inferolateral Q-waves with T-wave inversion in  inferior and in V4-V6.   HOSPITAL COURSE:  [name] was transported emergently via [facility] to  [facility] directly to the catheterization lab.  Catheterization performed by Dr. Juanda Chance revealed EF of 45% inferolateral  hypokinesis.  His LAD was totaled post the diagonal with left-to-left  collaterals.  Circumflex had extensive disease consisting of a 40%  proximal, 70% mid, 95% mid, 70-80% OM-2, 70% proximal RCA, 90% branch  off the proximal RCA and 80% posterolateral.  Dr. Juanda Chance performed two  drug-eluting stents to the proximal and mid circumflex, reducing these  lesions to lessen 0%.  Catheterization was performed via the left  femoral secondary to prosthetic right lower extremity.  The patient was  entered into ADAPT-DES trial.  Nursing noted on the evening of October 29, 2006, a 26-beat run of wide complex tachycardia that was  asymptomatic.  By the morning of October 29, 2006, he was still having  some nausea and vomiting residual from his myocardial infarction but had  not had any further chest discomfort.  Tobacco cessation consult was  performed as well as cardiac rehab assisting with education and  ambulation.  By October 30, 2006, the patient was doing well.  After  review, Dr. Excell Seltzer felt  that the patient could be discharged home on the  afternoon of October 30, 2006.   DISPOSITION:  The patient is discharged home.   NEW MEDICATIONS:  Include  1. Aspirin 325 mg daily.  2. Plavix 75 mg daily.  3. Lipitor 80 mg daily at bedtime.  4. Altace 10 mg daily.  5. Nitroglycerin 0.4 as needed.   HOME MEDICATIONS:  He was asked to continue his home medications  1. Enbrel weekly.  2. __________  daily.   WOUND CARE:  As per supplemental sheet.  He was advised no lifting,  driving, sexual activity or heavy exertion for 2 weeks.  He may return  to work after MD visit.   FOLLOW UP:  He will need blood work in 6-8 weeks in regards to FLP and  LFTs.  Advised no smoking or tobacco products and to bring all  medications to all appointments.   DISCHARGE TIME:  35  minutes.      Joellyn Rued, PA-C      Veverly Fells. Excell Seltzer, MD  Electronically Signed    EW/MEDQ  D:  10/30/2006  T:  10/30/2006  Job:  [id number]   cc:   Geronimo Boot, M.D.  Alford Highland, P.A.
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
## 2010-05-16 NOTE — Assessment & Plan Note (Signed)
[facility]ALTHCARE                          [facility]   NAME:Stelly, KADAR CHANCE                       MRN:          [id number]  DATE:12/09/2007                            DOB:          Aug 13, 1965    Mr. Lard is seen for cardiology followup.  He has known coronary  disease.  He received stents to the mid circumflex in October 2008.  He  does have residual disease.  His original symptom was chest burning and  arm burning.  With significant exertion, he has had some episodes of  chest and arm burning.  Sometimes he uses a nitroglycerin with help,  sometimes he does not need nitroglycerin.  He is not limited by this,  but he is definitely having some symptoms.  He is taking his  medications.  He does not have shortness of breath.  He does not have  nausea or vomiting.   PAST MEDICAL HISTORY:   ALLERGIES:  No known drug allergies.   MEDICATIONS:  1. Plavix 75.  2. Altace 10.  3. Enbrel weekly (for skin disease).  4. Neurontin 800.  5. Advair.  6. Cleocin lotion.  7. Zocor 80.  8. Aspirin 81.  9. Lopid.   OTHER MEDICAL PROBLEMS:  See the list on the note of April 04, 2007.   REVIEW OF SYSTEMS:  He is not having any GI or GU symptoms.  He has no  fevers or chills, and no headaches.  Otherwise, his review of systems is  negative.   EKG is reviewed.  There is no diagnostic abnormality on EKG.   PHYSICAL EXAMINATION:  VITAL SIGNS:  Blood pressure is 129/74 with a  pulse of 60, weight is 211 pounds.  GENERAL:  The patient is oriented to person, time, and place.  Affect is  normal.  HEENT:  No xanthelasma.  He has normal extraocular motion.  NECK:  There are no carotid bruits.  There is no jugular venous  distention.  LUNGS:  Clear.  Respiratory effort is not labored.  CARDIAC:  S1 with an S2.  There are no clicks or significant murmurs.  ABDOMEN:  Soft.  EXTREMITIES:  He has no peripheral edema.  The patient has a prosthesis  for his  right leg above the knee amputation.   Problems are listed on the note of April 04, 2007.  He is having some arm  discomfort and this may well be angina.  I will add 30 mg of Imdur.  We  will do an adenosine Myoview.  I will then see him back to further  discuss his overall status.  His other problems are stable at this time.     Luis Abed, MD, [facility]  Electronically Signed    JDK/MedQ  DD: 12/09/2007  DT: 12/09/2007  Job #: [id number]   cc:   Geronimo Boot, MD @ [facility]
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
## 2010-05-16 NOTE — Assessment & Plan Note (Signed)
[facility]ALTHCARE                          [facility]   NAME:[name], JAXIN FULFER                       MRN:          [id number]  DATE:12/29/2007                            DOB:          17-Feb-1965    REFERRING PHYSICIAN:  Dr. Myrtis Ser   REFERRING PHYSICIAN:  Dr. Myrtis Ser   PRIMARY CARDIOLOGIST:  Dr. Andee Lineman   REASON FOR VISIT:  Abnormal stress test.   Mr. Ptacek is a 45 year old male well known to [place] with history of  coronary artery disease recently referred for an adenosine stress  Cardiolite by Dr. Myrtis Ser.  He presented to the clinic earlier this month  with complaint of exertional chest and arm burning, at times requiring a  nitroglycerin tablet.  Medications were adjusted with the addition of  Imdur 30 mg daily, and he was referred for an adenosine stress  Cardiolite by Dr. Myrtis Ser.  Profusion imaging suggested moderate peri-  infarct ischemia in the distal anterior wall and apex in addition to a  large scar in the distal anterior wall, septum, and apex.  Ejection  fraction was calculated at 36%.   The patient is, thus, brought to the office today as an add-on for  review of these test results and continued close monitoring of his  symptoms.  In essence, he states that this is reminiscent of the  symptoms which preceded his acute myocardial infarction back in October  2008.  Following his intervention, however, he had not had any symptoms  until this past August 2008.  There has been some progression in the  symptoms since then which are typically precipitated by exertion such as  walking briskly.  He denies any such symptoms at rest.  There is some  radiation to the left arm and shoulder.  He has had to take some  nitroglycerin on occasion, no more than 1 tablet, with prompt relief.  His last significant episode was this past Saturday, and he has not had  any recurrent chest burning since then.   INCOMPLETE DICTATION     Gene Serpe,  PA-C       Jonelle Sidle, MD    GS/MedQ  DD: 12/29/2007  DT: 12/29/2007  Job #: [id number]   cc:   Geronimo Boot, M.D.

## 2010-05-16 NOTE — Cardiovascular Report (Signed)
NAMEBREVIN, [name] NO.:  [id number]   MEDICAL RECORD NO.:  [id number]          PATIENT TYPE:  OIB   LOCATION:  1962                         FACILITY:  [facility]   PHYSICIAN:  Rollene Rotunda, MD, FACCDATE OF BIRTH:  Jun 29, 1965   DATE OF PROCEDURE:  DATE OF DISCHARGE:  12/30/2007                            CARDIAC CATHETERIZATION   PRIMARY CARE PHYSICIAN:  Geronimo Boot, MD   CARDIOLOGIST:  Learta Codding, MD, [facility]   PROCEDURE:  Left heart catheterization/coronary arteriography.   INDICATIONS:  Evaluate the patient with chest pain.  He has known  coronary disease with a previously occluded LAD.  He has had a stent to  his circumflex.  He had a stress perfusion study which demonstrated  ejection fraction of about 36%.  There was LAD infarct with peri-infarct  ischemia.   PROCEDURE NOTE:  Left heart catheterization was performed via the left  femoral artery, the artery was cannulated using a Smart needle.  A #4-  [place] arterial sheath was inserted via the modified Seldinger  technique.  Preformed Judkins and a pigtail catheter were utilized.  The  patient tolerated the procedure well and left the lab in stable  condition.   RESULTS:  Hemodynamics:  LV 105/18, AO 106/81.  Coronaries:  The left  main was normal.  The LAD had ostial 25% stenosis.  It was occluded in  the mid segment after first diagonal.  The first diagonal was large with  mid 30% stenosis.  The circumflex in the AV groove had proximal 40%  stenosis at the second obtuse marginal.  There was a mid stent which was  widely patent.  The first obtuse marginal was tiny with moderate diffuse  plaque.  The second obtuse marginal was a small-to-moderate sized vessel  with proximal 70% stenosis.  The third obtuse marginal was large with  diffuse 25% stenosis.  The right coronary artery was a dominant vessel.  It was occluded after the acute marginal.  The PDA was moderate-sized  with diffuse disease.  It  was seen to fill via left-to-right  collaterals.  Left ventriculogram:  A left ventriculogram was obtained  in the RAO projection.  The EF was 50% with mild inferior hypokinesis.   CONCLUSION:  Severe 2-vessel coronary artery disease.  There is a patent  circumflex stent.  There is a mildly reduced LV ejection fraction.   PLAN:  At this point, I would suggest medical management, but will  review the films.  We could also consider if he is particularly  symptomatic despite maximal meds bypass surgery, although it is not  clear that the LAD would be a reasonable target.  The right coronary  would be a reasonable target.      Rollene Rotunda, MD, [facility]  Electronically Signed     JH/MEDQ  D:  12/30/2007  T:  12/30/2007  Job:  [id number]   cc:   Learta Codding, MD,FACC  Geronimo Boot, MD
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
## 2010-06-02 ENCOUNTER — Encounter: Payer: Self-pay | Admitting: Cardiology

## 2010-06-02 DIAGNOSIS — I10 Essential (primary) hypertension: Secondary | ICD-10-CM | POA: Insufficient documentation

## 2010-06-02 DIAGNOSIS — I739 Peripheral vascular disease, unspecified: Secondary | ICD-10-CM | POA: Insufficient documentation

## 2010-06-02 DIAGNOSIS — I2510 Atherosclerotic heart disease of native coronary artery without angina pectoris: Secondary | ICD-10-CM | POA: Insufficient documentation

## 2010-06-02 DIAGNOSIS — Z951 Presence of aortocoronary bypass graft: Secondary | ICD-10-CM | POA: Insufficient documentation

## 2010-06-02 DIAGNOSIS — E785 Hyperlipidemia, unspecified: Secondary | ICD-10-CM | POA: Insufficient documentation

## 2010-06-02 DIAGNOSIS — Z89619 Acquired absence of unspecified leg above knee: Secondary | ICD-10-CM | POA: Insufficient documentation

## 2010-06-02 DIAGNOSIS — R9430 Abnormal result of cardiovascular function study, unspecified: Secondary | ICD-10-CM | POA: Insufficient documentation

## 2010-06-02 DIAGNOSIS — Z720 Tobacco use: Secondary | ICD-10-CM | POA: Insufficient documentation

## 2010-06-05 ENCOUNTER — Encounter: Payer: Self-pay | Admitting: *Deleted

## 2010-06-06 ENCOUNTER — Encounter: Payer: Self-pay | Admitting: Cardiology

## 2010-06-06 ENCOUNTER — Ambulatory Visit (INDEPENDENT_AMBULATORY_CARE_PROVIDER_SITE_OTHER): Payer: Medicare Other | Admitting: Cardiology

## 2010-06-06 ENCOUNTER — Encounter: Payer: Self-pay | Admitting: *Deleted

## 2010-06-06 DIAGNOSIS — I2510 Atherosclerotic heart disease of native coronary artery without angina pectoris: Secondary | ICD-10-CM

## 2010-06-06 DIAGNOSIS — E785 Hyperlipidemia, unspecified: Secondary | ICD-10-CM

## 2010-06-06 DIAGNOSIS — I10 Essential (primary) hypertension: Secondary | ICD-10-CM

## 2010-06-06 MED ORDER — ISOSORBIDE MONONITRATE ER 60 MG PO TB24: 60.0000 mg | ORAL_TABLET | ORAL | Status: DC

## 2010-06-06 NOTE — Assessment & Plan Note (Signed)
I am trying to find his recent lipid results.  These will be located .  If not done he will have a lipid profile.
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
## 2010-06-06 NOTE — Assessment & Plan Note (Signed)
Coronary disease is stable. No change in therapy. 

## 2010-06-06 NOTE — Patient Instructions (Signed)
Your physician wants you to follow-up in: 6 months. You will receive a reminder letter in the mail one-two months in advance. If you don't receive a letter, please call our office to schedule the follow-up appointment. Increase Imdur (isosorbide) to 60 mg daily. You may take 2 of your 30 mg tablets until gone and then get new prescription filled for 60 mg tablets.

## 2010-06-06 NOTE — Assessment & Plan Note (Signed)
Systolic blood pressure is mildly elevated today.  He is in lower dose will be increased.

## 2010-08-04 ENCOUNTER — Other Ambulatory Visit: Payer: Self-pay | Admitting: *Deleted

## 2010-08-04 MED ORDER — ATORVASTATIN CALCIUM 40 MG PO TABS: 40.0000 mg | ORAL_TABLET | Freq: Every day | ORAL | Status: DC

## 2010-09-06 ENCOUNTER — Other Ambulatory Visit: Payer: Self-pay | Admitting: Cardiology

## 2010-09-06 ENCOUNTER — Other Ambulatory Visit: Payer: Self-pay | Admitting: Physician Assistant

## 2010-10-11 ENCOUNTER — Other Ambulatory Visit: Payer: Self-pay | Admitting: Physician Assistant

## 2010-10-11 LAB — BASIC METABOLIC PANEL
BUN: 6
BUN: 8
CO2: 26
CO2: 28
Calcium: 9.1
GFR calc non Af Amer: >60
Glucose, Bld: 100 — ABNORMAL HIGH
Glucose, Bld: 140 — ABNORMAL HIGH
Potassium: 4
Sodium: 135
Sodium: 138

## 2010-10-11 LAB — CBC
HCT: 46.4
Hemoglobin: 16.1
MCHC: 34.7
RBC: 5.25
RDW: 12.5

## 2010-10-11 LAB — LIPID PANEL
LDL Cholesterol: 123 — ABNORMAL HIGH
Total CHOL/HDL Ratio: 7.9
Triglycerides: 109
VLDL: 22

## 2010-10-11 LAB — CARDIAC PANEL(CRET KIN+CKTOT+MB+TROPI)
CK, MB: 143.5 — ABNORMAL HIGH
CK, MB: 44 — ABNORMAL HIGH
CK, MB: 88.5 — ABNORMAL HIGH
Total CK: 1820 — ABNORMAL HIGH
Total CK: 905 — ABNORMAL HIGH
Troponin I: 28.26
Troponin I: 47.91

## 2010-10-11 LAB — HEMOGLOBIN A1C: Hgb A1c MFr Bld: 5.6

## 2010-10-11 LAB — TSH
TSH: 0.838
TSH: 1.248

## 2010-12-07 ENCOUNTER — Other Ambulatory Visit: Payer: Self-pay | Admitting: Cardiology

## 2010-12-15 ENCOUNTER — Telehealth: Payer: Self-pay | Admitting: *Deleted

## 2010-12-15 NOTE — Telephone Encounter (Signed)
Pt of Dr. Henrietta Hoover who primary MD requested that he have a carotid doppler done. Pt's wife notified to have primary MD fax an order to our office and we will schedule. Otherwise, Dr. Myrtis Ser can evaluate at 1/7 OV>

## 2011-01-08 ENCOUNTER — Ambulatory Visit (INDEPENDENT_AMBULATORY_CARE_PROVIDER_SITE_OTHER): Payer: Medicare Other | Admitting: Cardiology

## 2011-01-08 ENCOUNTER — Encounter: Payer: Self-pay | Admitting: Cardiology

## 2011-01-08 VITALS — BP 141/85 | HR 74 | Ht 72.0 in | Wt 250.0 lb

## 2011-01-08 DIAGNOSIS — R0989 Other specified symptoms and signs involving the circulatory and respiratory systems: Secondary | ICD-10-CM

## 2011-01-08 DIAGNOSIS — E785 Hyperlipidemia, unspecified: Secondary | ICD-10-CM

## 2011-01-08 DIAGNOSIS — I2510 Atherosclerotic heart disease of native coronary artery without angina pectoris: Secondary | ICD-10-CM

## 2011-01-08 DIAGNOSIS — I10 Essential (primary) hypertension: Secondary | ICD-10-CM

## 2011-01-08 NOTE — Patient Instructions (Addendum)
Your physician wants you to follow-up in: 6 months. You will receive a reminder letter in the mail one-two months in advance. If you don't receive a letter, please call our office to schedule the follow-up appointment. Your physician recommends that you continue on your current medications as directed. Please refer to the Current Medication list given to you today. Your physician has requested that you have a carotid duplex. This test is an ultrasound of the carotid arteries in your neck. It looks at blood flow through these arteries that supply the brain with blood. Allow one hour for this exam. There are no restrictions or special instructions.  If the results of your test are normal or stable, you will receive a letter. If they are abnormal, the nurse will contact you by phone. 
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
## 2011-01-08 NOTE — Assessment & Plan Note (Signed)
Patient is receiving treatment for his lipids.

## 2011-01-08 NOTE — Assessment & Plan Note (Signed)
Blood pressure is adequately controlled. No change in therapy. 

## 2011-01-08 NOTE — Assessment & Plan Note (Signed)
The patient's overall coronary status is stable. No change in therapy.

## 2011-01-08 NOTE — Assessment & Plan Note (Signed)
There is a soft left carotid bruit. Carotid Dopplers will be scheduled.

## 2011-01-25 ENCOUNTER — Encounter (INDEPENDENT_AMBULATORY_CARE_PROVIDER_SITE_OTHER): Payer: Medicare Other | Admitting: *Deleted

## 2011-01-25 DIAGNOSIS — I6529 Occlusion and stenosis of unspecified carotid artery: Secondary | ICD-10-CM

## 2011-01-25 DIAGNOSIS — R0989 Other specified symptoms and signs involving the circulatory and respiratory systems: Secondary | ICD-10-CM

## 2011-01-29 ENCOUNTER — Telehealth: Payer: Self-pay | Admitting: *Deleted

## 2011-01-29 NOTE — Telephone Encounter (Signed)
Pt notified of results and verbalized understanding  

## 2011-01-29 NOTE — Telephone Encounter (Signed)
Message copied by Arlyss Gandy on Mon Jan 29, 2011  4:35 PM ------      Message from: Myrtis Ser, [place] D      Created: Mon Jan 29, 2011 10:34 AM       Please let him know mild narrowing. We will follow over time.

## 2011-02-02 ENCOUNTER — Other Ambulatory Visit: Payer: Self-pay | Admitting: Cardiology

## 2011-03-02 ENCOUNTER — Other Ambulatory Visit: Payer: Self-pay | Admitting: Cardiology

## 2011-04-05 ENCOUNTER — Other Ambulatory Visit: Payer: Self-pay | Admitting: Cardiology

## 2011-05-03 ENCOUNTER — Other Ambulatory Visit: Payer: Self-pay | Admitting: Cardiology

## 2011-05-03 ENCOUNTER — Other Ambulatory Visit: Payer: Self-pay

## 2011-05-03 MED ORDER — METOPROLOL SUCCINATE ER 25 MG PO TB24: 25.0000 mg | ORAL_TABLET | Freq: Every day | ORAL | Status: DC

## 2011-07-06 ENCOUNTER — Other Ambulatory Visit: Payer: Self-pay | Admitting: Cardiology

## 2011-07-06 NOTE — Telephone Encounter (Signed)
Refilled ramipril. 

## 2011-07-10 ENCOUNTER — Ambulatory Visit: Payer: Medicare Other | Admitting: Cardiology

## 2011-09-04 ENCOUNTER — Ambulatory Visit: Payer: Medicare Other | Admitting: Cardiology

## 2011-10-08 ENCOUNTER — Encounter: Payer: Self-pay | Admitting: Cardiology

## 2011-10-08 DIAGNOSIS — R0989 Other specified symptoms and signs involving the circulatory and respiratory systems: Secondary | ICD-10-CM | POA: Insufficient documentation

## 2011-10-09 ENCOUNTER — Ambulatory Visit: Payer: Medicare Other | Admitting: Cardiology

## 2011-10-09 ENCOUNTER — Encounter: Payer: Self-pay | Admitting: Cardiology

## 2011-10-13 IMAGING — CR DG CHEST 1V PORT
1 series · 1 of 1 positions shown · non-contrast
Comparison: Portable exam 6442 hours compared to preoperative exam
of 10/24/2009

CLINICAL DATA: Coronary artery disease status post CABG

PORTABLE CHEST - 1 VIEW

[AP]
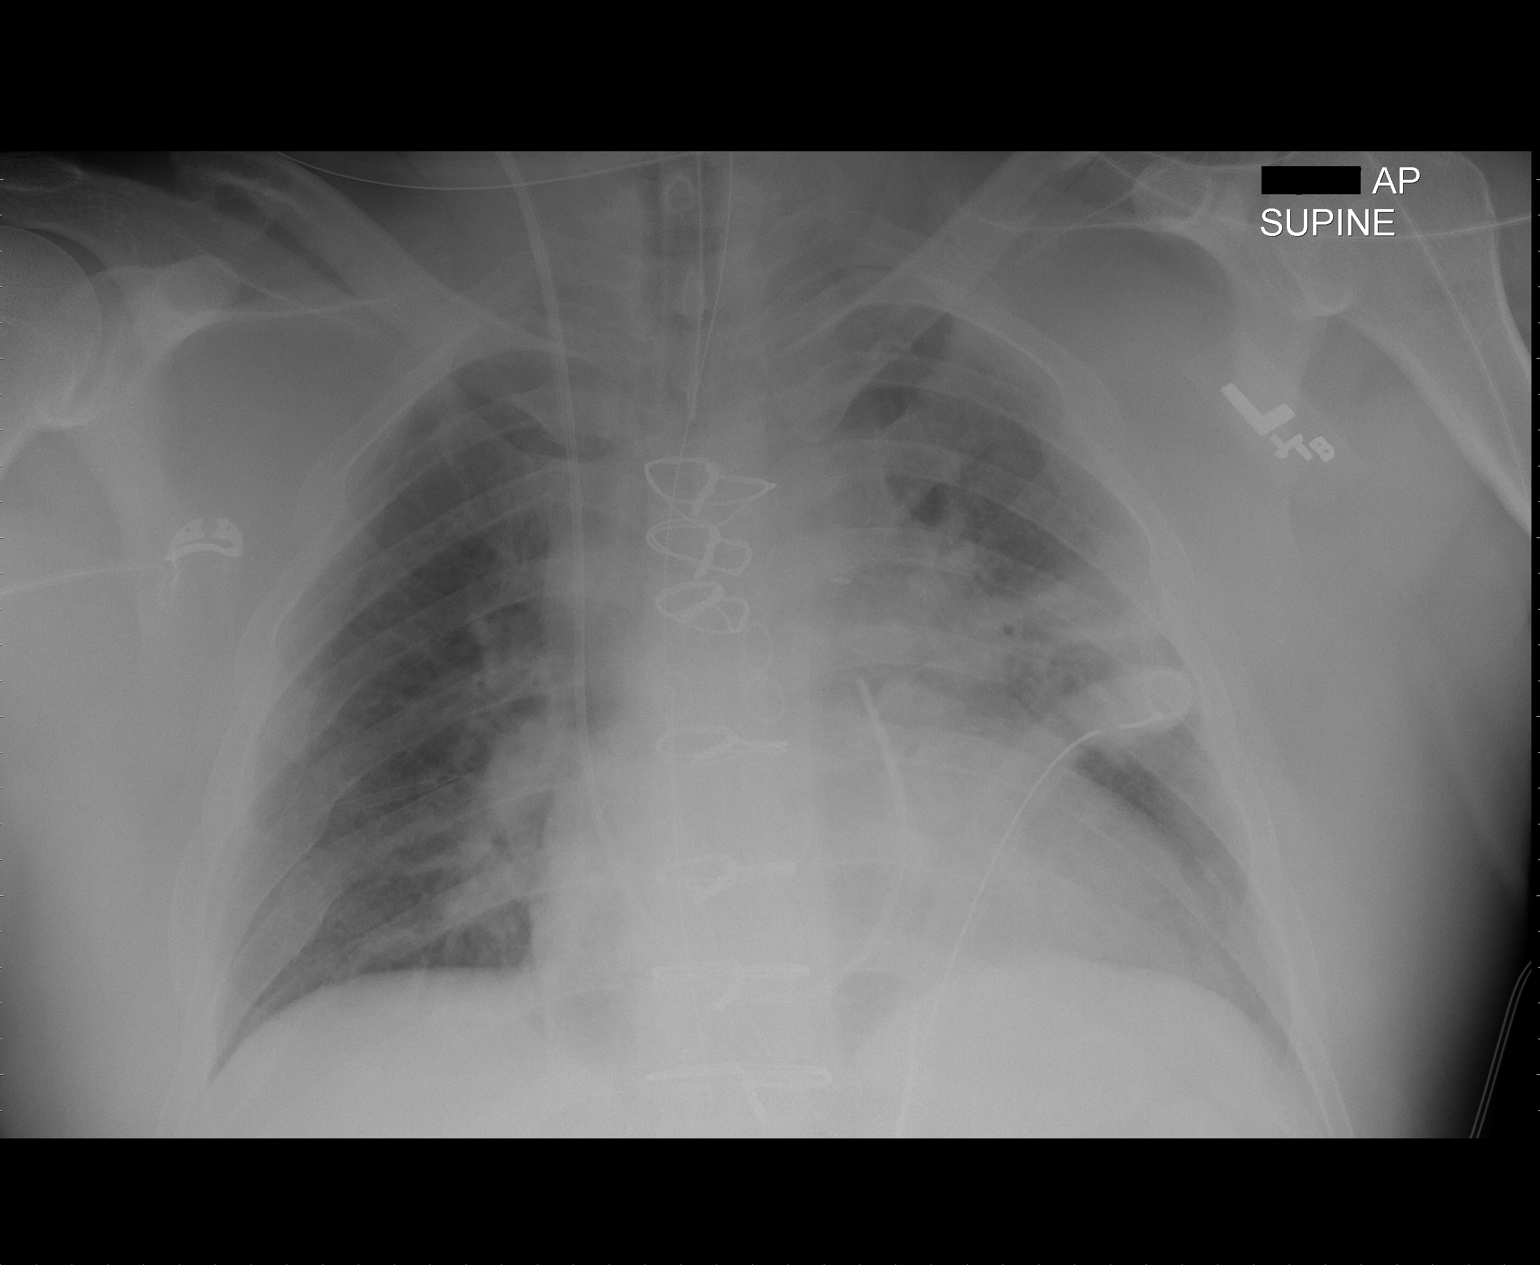

[1 of 1 positions shown; findings below may reference images not displayed]

FINDINGS: Tip of endotracheal tube 4.2 cm above carina.
Nasogastric tube extends into abdomen.
Mediastinal drain and left thoracostomy tube present.
Right jugular Swan-Ganz catheter, tip in main pulmonary artery near
bifurcation.
Enlargement of cardiac silhouette post CABG.
Pulmonary vascular congestion.
Minimal perihilar edema.
Atelectasis left mid lung.
No gross pleural effusion or pneumothorax.
Old right rib fractures.
IMPRESSION: Postoperative changes of CABG as above.

## 2011-10-14 IMAGING — CR DG CHEST 1V PORT
1 series · 1 of 1 positions shown · non-contrast
Comparison: 10/27/2009

CLINICAL DATA: CABG, chest tube

PORTABLE CHEST - 1 VIEW

[view not recorded]
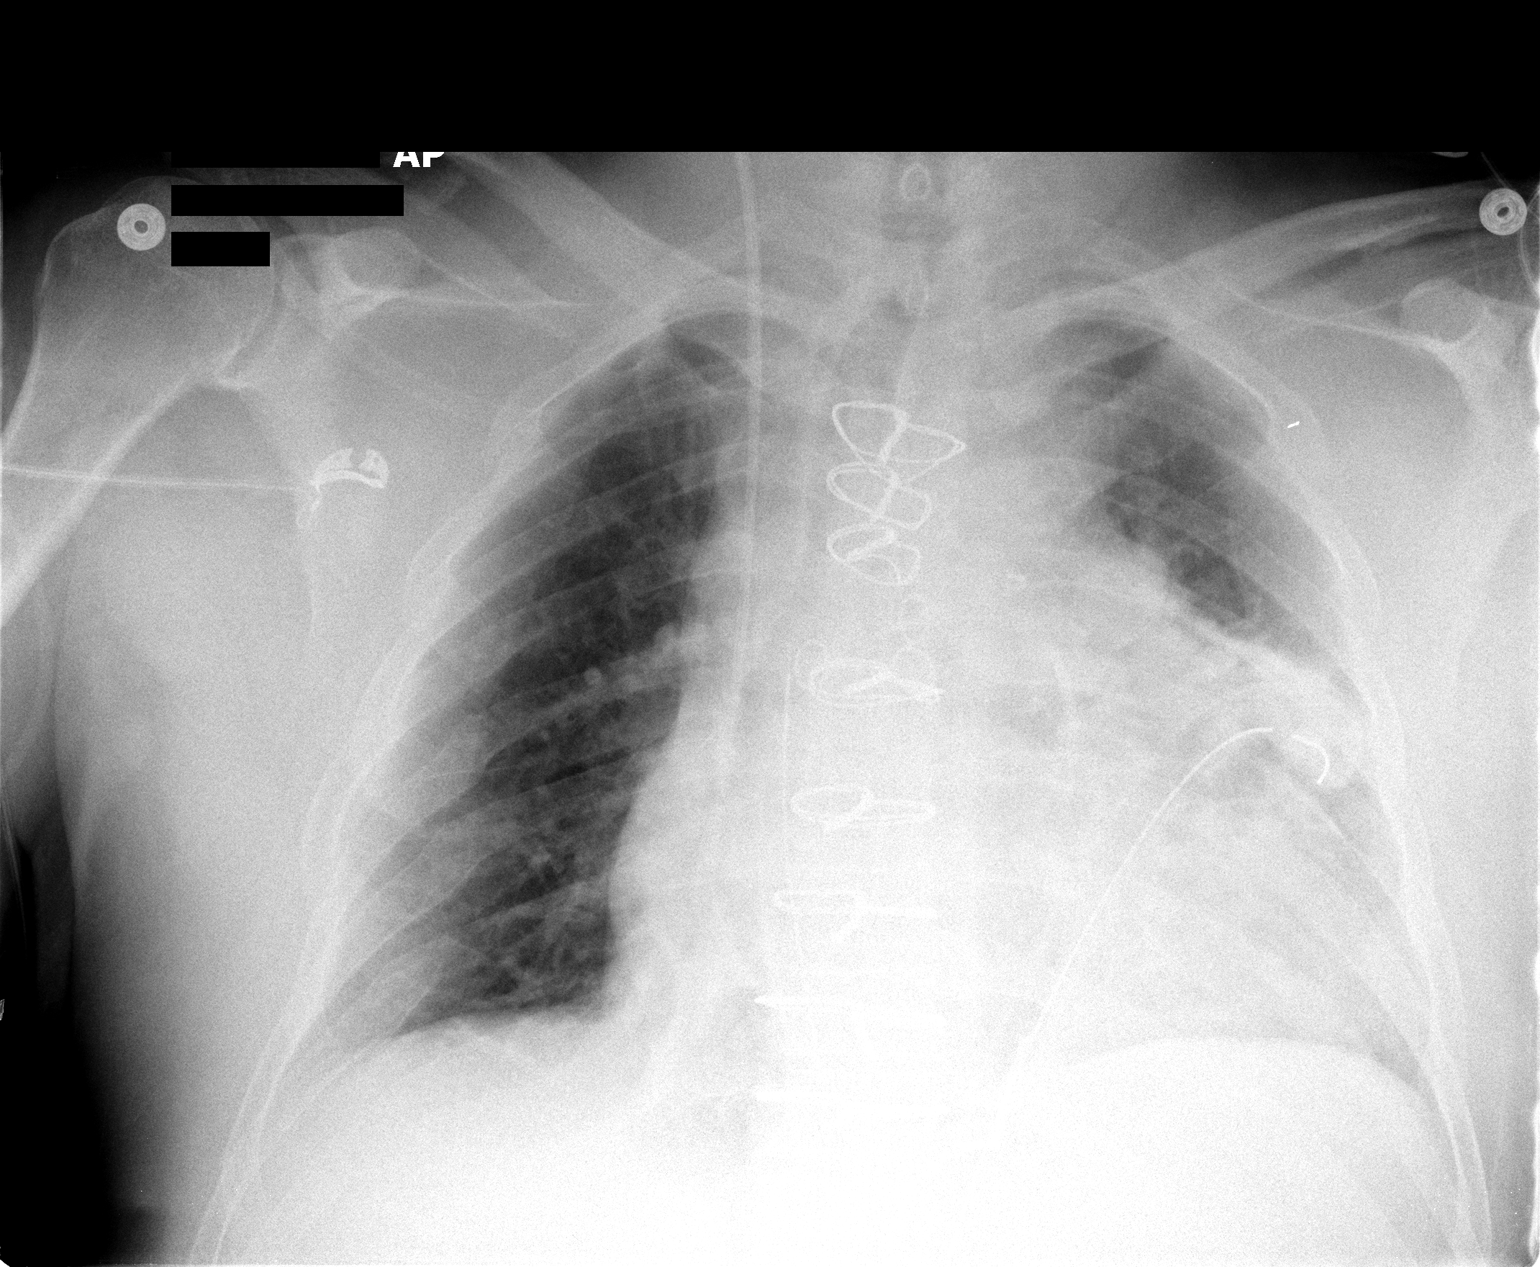

[1 of 1 positions shown; findings below may reference images not displayed]

FINDINGS: Endotracheal tube has been removed.  Swan-Ganz catheter
remains in the main pulmonary artery.  Left chest tube in place
without pneumothorax.

Left lower lobe atelectasis is slightly improved.  Improved
aeration in the right lung.  Negative for edema or effusion.
IMPRESSION: Mild improvement in aeration in the lung bases.  Negative for
pneumothorax.

## 2011-10-15 IMAGING — CR DG CHEST 1V PORT
1 series · 1 of 1 positions shown · non-contrast
Comparison: 10/28/2009

CLINICAL DATA: Follow up CABG

PORTABLE CHEST - 1 VIEW

[AP]
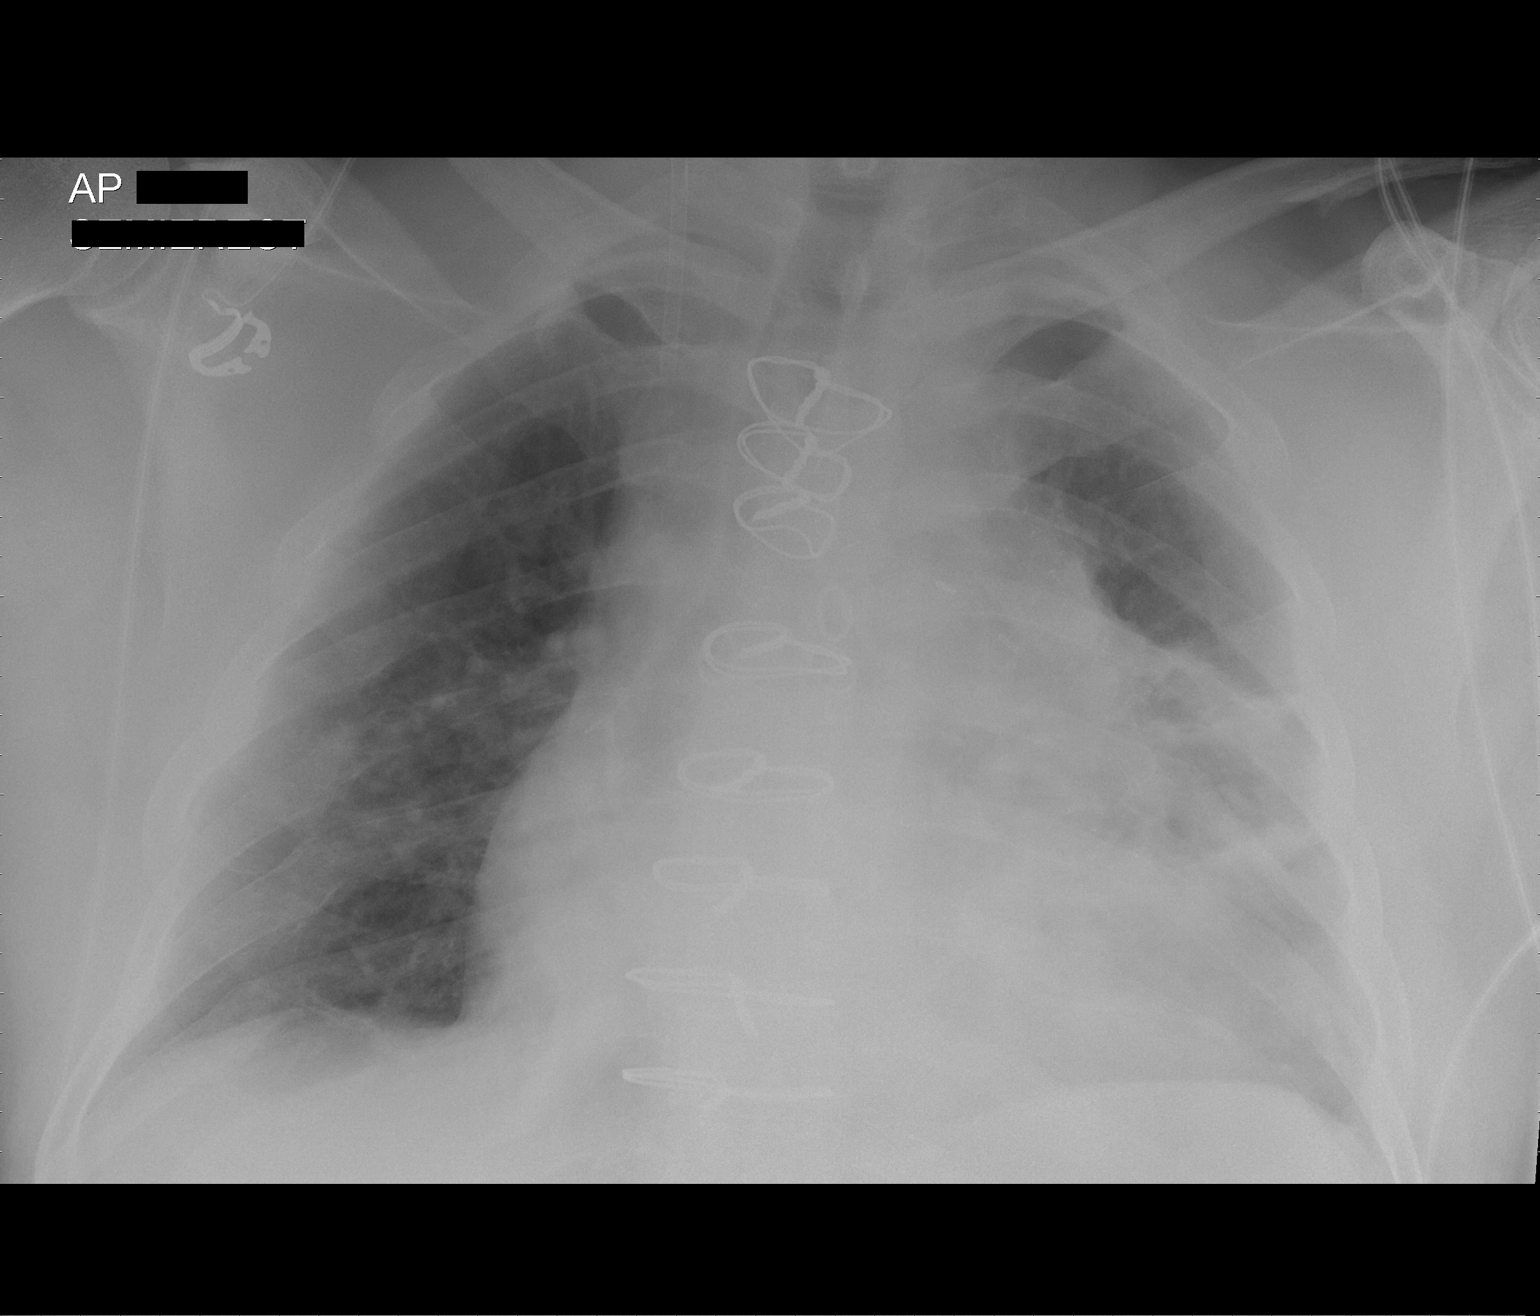

[1 of 1 positions shown; findings below may reference images not displayed]

FINDINGS: Portable semi upright view of the chest demonstrates
enlargement of the cardiac silhouette.  The left chest tube and
mediastinal drain have been removed.  There is  lucency in the left
apex which is similar to the right lung apex.  There is not clear
evidence for a pneumothorax.  Swan-Ganz catheter has been removed
but the right jugular central venous catheter remains in place.
There are low lung volumes.  Linear densities in the left lung are
probably related to areas of volume loss.
IMPRESSION: Removal of chest drains without a large pneumothorax.  There is
lucency at both lung apices but no definitive pneumothorax.

Patchy areas of presumed atelectasis.

Low lung volumes.
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
## 2011-11-14 ENCOUNTER — Other Ambulatory Visit: Payer: Self-pay | Admitting: *Deleted

## 2011-11-14 MED ORDER — METOPROLOL SUCCINATE ER 25 MG PO TB24: 25.0000 mg | ORAL_TABLET | Freq: Every day | ORAL | Status: DC

## 2011-12-21 ENCOUNTER — Other Ambulatory Visit: Payer: Self-pay | Admitting: *Deleted

## 2011-12-21 MED ORDER — CLOPIDOGREL BISULFATE 75 MG PO TABS: 75.0000 mg | ORAL_TABLET | Freq: Every day | ORAL | Status: DC

## 2012-01-09 ENCOUNTER — Telehealth: Payer: Self-pay | Admitting: *Deleted

## 2012-01-09 ENCOUNTER — Ambulatory Visit (INDEPENDENT_AMBULATORY_CARE_PROVIDER_SITE_OTHER): Payer: Medicare Other | Admitting: Cardiology

## 2012-01-09 ENCOUNTER — Encounter: Payer: Self-pay | Admitting: Cardiology

## 2012-01-09 VITALS — BP 145/86 | HR 71 | Ht 72.0 in | Wt 239.0 lb

## 2012-01-09 DIAGNOSIS — I739 Peripheral vascular disease, unspecified: Secondary | ICD-10-CM

## 2012-01-09 DIAGNOSIS — I2510 Atherosclerotic heart disease of native coronary artery without angina pectoris: Secondary | ICD-10-CM

## 2012-01-09 DIAGNOSIS — R0989 Other specified symptoms and signs involving the circulatory and respiratory systems: Secondary | ICD-10-CM

## 2012-01-09 DIAGNOSIS — I10 Essential (primary) hypertension: Secondary | ICD-10-CM

## 2012-01-09 MED ORDER — RAMIPRIL 10 MG PO CAPS: 10.0000 mg | ORAL_CAPSULE | Freq: Every day | ORAL | Status: DC

## 2012-01-09 MED ORDER — RAMIPRIL 10 MG PO CAPS: 10.0000 mg | ORAL_CAPSULE | Freq: Two times a day (BID) | ORAL | Status: DC

## 2012-01-09 MED ORDER — ISOSORBIDE MONONITRATE ER 60 MG PO TB24: 60.0000 mg | ORAL_TABLET | Freq: Every day | ORAL | Status: DC

## 2012-01-09 MED ORDER — ATORVASTATIN CALCIUM 40 MG PO TABS: 40.0000 mg | ORAL_TABLET | Freq: Every day | ORAL | Status: DC

## 2012-01-09 MED ORDER — METOPROLOL SUCCINATE ER 25 MG PO TB24: 25.0000 mg | ORAL_TABLET | Freq: Every day | ORAL | Status: DC

## 2012-01-09 NOTE — Telephone Encounter (Signed)
Received message from pharmacy stating the ramipril rx sent today was for daily and all previous prescriptions sent for patient has been twice daily and needed clarification. Nurse called and informed the pharmacy that rx to remain at twice daily.

## 2012-01-09 NOTE — Assessment & Plan Note (Signed)
He has mild carotid disease. He'll have a followup Doppler next year.

## 2012-01-09 NOTE — Assessment & Plan Note (Signed)
Blood pressure is controlled. No change in therapy. 

## 2012-01-09 NOTE — Patient Instructions (Addendum)

## 2012-01-09 NOTE — Assessment & Plan Note (Signed)
The patient's coronary disease is stable. He is on appropriate medications. No further workup is needed.
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
## 2012-01-09 NOTE — Assessment & Plan Note (Signed)
We know that he has total occlusion of the right femoral system. He is stable. No further workup.

## 2013-01-14 ENCOUNTER — Encounter: Payer: Self-pay | Admitting: Cardiology

## 2013-01-14 ENCOUNTER — Ambulatory Visit (INDEPENDENT_AMBULATORY_CARE_PROVIDER_SITE_OTHER): Payer: Medicare Other | Admitting: Cardiology

## 2013-01-14 VITALS — BP 126/81 | HR 73 | Ht 72.0 in | Wt 221.0 lb

## 2013-01-14 DIAGNOSIS — I10 Essential (primary) hypertension: Secondary | ICD-10-CM

## 2013-01-14 DIAGNOSIS — I2510 Atherosclerotic heart disease of native coronary artery without angina pectoris: Secondary | ICD-10-CM

## 2013-01-14 DIAGNOSIS — R0989 Other specified symptoms and signs involving the circulatory and respiratory systems: Secondary | ICD-10-CM

## 2013-01-14 NOTE — Assessment & Plan Note (Signed)
Coronary disease is stable. He is on appropriate medications. No change in therapy.

## 2013-01-14 NOTE — Patient Instructions (Signed)
Your physician recommends that you schedule a follow-up appointment in: 1 year. You will receive a reminder letter in the mail in about 10 months reminding you to call and schedule your appointment. If you don't receive this letter, please contact our office. Your physician recommends that you continue on your current medications as directed. Please refer to the Current Medication list given to you today. Your physician has requested that you have a carotid duplex. This test is an ultrasound of the carotid arteries in your neck. It looks at blood flow through these arteries that supply the brain with blood. Allow one hour for this exam. There are no restrictions or special instructions.  
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
## 2013-01-14 NOTE — Assessment & Plan Note (Signed)
He had mild carotid disease in 2013. It is time for a followup carotid Doppler and this will be scheduled.

## 2013-01-14 NOTE — Assessment & Plan Note (Signed)
Blood pressures rechecked in the left arm and is normal. There is no evidence of these had significant hypertension. No change in therapy.

## 2013-01-21 ENCOUNTER — Encounter (INDEPENDENT_AMBULATORY_CARE_PROVIDER_SITE_OTHER): Payer: Medicare Other

## 2013-01-21 DIAGNOSIS — I6529 Occlusion and stenosis of unspecified carotid artery: Secondary | ICD-10-CM

## 2013-01-21 DIAGNOSIS — R0989 Other specified symptoms and signs involving the circulatory and respiratory systems: Secondary | ICD-10-CM

## 2013-01-23 ENCOUNTER — Telehealth: Payer: Self-pay | Admitting: *Deleted

## 2013-01-23 ENCOUNTER — Encounter: Payer: Self-pay | Admitting: Cardiology

## 2013-01-23 NOTE — Telephone Encounter (Signed)
Message copied by Eustace MooreANDERSON, Jonn Chaikin M on Fri Jan 23, 2013  3:32 PM ------      Message from: Wilton[place]ATZ, [place]FFRE[place] D      Created: Fri Jan 23, 2013 11:51 AM       Please let patient know that carotid Doppler is stable ------

## 2013-01-23 NOTE — Telephone Encounter (Signed)
Patient informed. 

## 2013-03-05 ENCOUNTER — Emergency Department (HOSPITAL_COMMUNITY): Payer: No Typology Code available for payment source

## 2013-03-05 ENCOUNTER — Encounter: Payer: Self-pay | Admitting: Cardiology

## 2013-03-05 ENCOUNTER — Encounter (HOSPITAL_COMMUNITY): Payer: Self-pay | Admitting: Radiology

## 2013-03-05 ENCOUNTER — Emergency Department (HOSPITAL_COMMUNITY)
Admission: EM | Admit: 2013-03-05 | Discharge: 2013-03-05 | Disposition: A | Discharge status: Home / Self Care | Payer: No Typology Code available for payment source | Attending: Emergency Medicine | Admitting: Emergency Medicine

## 2013-03-05 DIAGNOSIS — Z7982 Long term (current) use of aspirin: Secondary | ICD-10-CM | POA: Diagnosis not present

## 2013-03-05 DIAGNOSIS — Y92410 Unspecified street and highway as the place of occurrence of the external cause: Secondary | ICD-10-CM | POA: Insufficient documentation

## 2013-03-05 DIAGNOSIS — Z23 Encounter for immunization: Secondary | ICD-10-CM | POA: Insufficient documentation

## 2013-03-05 DIAGNOSIS — R000 Tachycardia, unspecified: Secondary | ICD-10-CM | POA: Insufficient documentation

## 2013-03-05 DIAGNOSIS — Z7902 Long term (current) use of antithrombotics/antiplatelets: Secondary | ICD-10-CM | POA: Diagnosis not present

## 2013-03-05 DIAGNOSIS — S52509A Unspecified fracture of the lower end of unspecified radius, initial encounter for closed fracture: Secondary | ICD-10-CM | POA: Insufficient documentation

## 2013-03-05 DIAGNOSIS — S022XXA Fracture of nasal bones, initial encounter for closed fracture: Secondary | ICD-10-CM | POA: Diagnosis not present

## 2013-03-05 DIAGNOSIS — Z79899 Other long term (current) drug therapy: Secondary | ICD-10-CM | POA: Insufficient documentation

## 2013-03-05 DIAGNOSIS — Z951 Presence of aortocoronary bypass graft: Secondary | ICD-10-CM | POA: Insufficient documentation

## 2013-03-05 DIAGNOSIS — S0990XA Unspecified injury of head, initial encounter: Secondary | ICD-10-CM | POA: Diagnosis not present

## 2013-03-05 DIAGNOSIS — E669 Obesity, unspecified: Secondary | ICD-10-CM | POA: Diagnosis not present

## 2013-03-05 DIAGNOSIS — Z792 Long term (current) use of antibiotics: Secondary | ICD-10-CM | POA: Insufficient documentation

## 2013-03-05 DIAGNOSIS — S0100XA Unspecified open wound of scalp, initial encounter: Secondary | ICD-10-CM | POA: Insufficient documentation

## 2013-03-05 DIAGNOSIS — S52609A Unspecified fracture of lower end of unspecified ulna, initial encounter for closed fracture: Principal | ICD-10-CM

## 2013-03-05 DIAGNOSIS — I2510 Atherosclerotic heart disease of native coronary artery without angina pectoris: Secondary | ICD-10-CM | POA: Diagnosis not present

## 2013-03-05 DIAGNOSIS — R4789 Other speech disturbances: Secondary | ICD-10-CM | POA: Diagnosis not present

## 2013-03-05 DIAGNOSIS — S0101XA Laceration without foreign body of scalp, initial encounter: Secondary | ICD-10-CM

## 2013-03-05 DIAGNOSIS — S59909A Unspecified injury of unspecified elbow, initial encounter: Secondary | ICD-10-CM | POA: Diagnosis present

## 2013-03-05 DIAGNOSIS — S6990XA Unspecified injury of unspecified wrist, hand and finger(s), initial encounter: Secondary | ICD-10-CM | POA: Diagnosis present

## 2013-03-05 DIAGNOSIS — Y9389 Activity, other specified: Secondary | ICD-10-CM | POA: Insufficient documentation

## 2013-03-05 LAB — TYPE AND SCREEN
ABO/RH(D): A POS
ANTIBODY SCREEN: NEGATIVE
Unit division: 0
Unit division: 0

## 2013-03-05 LAB — CBC WITH DIFFERENTIAL/PLATELET
Basophils Absolute: 0 10*3/uL (ref 0.0–0.1)
Basophils Relative: 1 % (ref 0–1)
EOS PCT: 2 % (ref 0–5)
Eosinophils Absolute: 0.1 10*3/uL (ref 0.0–0.7)
HEMATOCRIT: 44.7 % (ref 39.0–52.0)
Hemoglobin: 15.8 g/dL (ref 13.0–17.0)
LYMPHS PCT: 44 % (ref 12–46)
Lymphs Abs: 2.7 10*3/uL (ref 0.7–4.0)
MCH: 33.1 pg (ref 26.0–34.0)
MCHC: 35.3 g/dL (ref 30.0–36.0)
MCV: 93.7 fL (ref 78.0–100.0)
MONO ABS: 0.3 10*3/uL (ref 0.1–1.0)
Monocytes Relative: 4 % (ref 3–12)
Neutro Abs: 3.1 10*3/uL (ref 1.7–7.7)
Neutrophils Relative %: 50 % (ref 43–77)
Platelets: 161 10*3/uL (ref 150–400)
RBC: 4.77 MIL/uL (ref 4.22–5.81)
RDW: 13 % (ref 11.5–15.5)
WBC: 6.2 10*3/uL (ref 4.0–10.5)

## 2013-03-05 LAB — COMPREHENSIVE METABOLIC PANEL
ALT: 25 U/L (ref 0–53)
AST: 29 U/L (ref 0–37)
Albumin: 3.1 g/dL — ABNORMAL LOW (ref 3.5–5.2)
Alkaline Phosphatase: 79 U/L (ref 39–117)
BUN: 6 mg/dL (ref 6–23)
CALCIUM: 7.7 mg/dL — AB (ref 8.4–10.5)
CO2: 19 meq/L (ref 19–32)
Chloride: 102 mEq/L (ref 96–112)
Creatinine, Ser: 0.69 mg/dL (ref 0.50–1.35)
GLUCOSE: 135 mg/dL — AB (ref 70–99)
Potassium: 3.5 mEq/L — ABNORMAL LOW (ref 3.7–5.3)
Sodium: 138 mEq/L (ref 137–147)
Total Bilirubin: 0.3 mg/dL (ref 0.3–1.2)
Total Protein: 6.7 g/dL (ref 6.0–8.3)

## 2013-03-05 LAB — PREPARE FRESH FROZEN PLASMA
UNIT DIVISION: 0
Unit division: 0

## 2013-03-05 LAB — I-STAT TROPONIN, ED: Troponin i, poc: 0 ng/mL (ref 0.00–0.08)

## 2013-03-05 LAB — I-STAT CHEM 8, ED
BUN: 4 mg/dL — AB (ref 6–23)
CALCIUM ION: 0.95 mmol/L — AB (ref 1.12–1.23)
CREATININE: 1.1 mg/dL (ref 0.50–1.35)
Chloride: 105 mEq/L (ref 96–112)
Glucose, Bld: 134 mg/dL — ABNORMAL HIGH (ref 70–99)
HCT: 48 % (ref 39.0–52.0)
Hemoglobin: 16.3 g/dL (ref 13.0–17.0)
Potassium: 3.3 mEq/L — ABNORMAL LOW (ref 3.7–5.3)
Sodium: 141 mEq/L (ref 137–147)
TCO2: 20 mmol/L (ref 0–100)

## 2013-03-05 LAB — APTT: APTT: 27 s (ref 24–37)

## 2013-03-05 LAB — PROTIME-INR
INR: 1.08 (ref 0.00–1.49)
Prothrombin Time: 13.8 seconds (ref 11.6–15.2)

## 2013-03-05 LAB — I-STAT CG4 LACTIC ACID, ED: Lactic Acid, Venous: 2.02 mmol/L (ref 0.5–2.2)

## 2013-03-05 MED ORDER — HYDROCODONE-ACETAMINOPHEN 5-325 MG PO TABS
2.0000 | ORAL_TABLET | Freq: Once | ORAL | Status: AC
  Administered 2013-03-05: 2 via ORAL
  Filled 2013-03-05: qty 2

## 2013-03-05 MED ORDER — HYDROCODONE-ACETAMINOPHEN 5-325 MG PO TABS: 1.0000 | ORAL_TABLET | Freq: Once | ORAL | Status: DC

## 2013-03-05 MED ORDER — CEFAZOLIN SODIUM-DEXTROSE 2-3 GM-% IV SOLR
2.0000 g | INTRAVENOUS | Status: AC
  Administered 2013-03-05: 2 g via INTRAVENOUS
  Filled 2013-03-05: qty 50

## 2013-03-05 MED ORDER — SODIUM CHLORIDE 0.9 % IV BOLUS (SEPSIS)
1000.0000 mL | Freq: Once | INTRAVENOUS | Status: AC
  Administered 2013-03-05: 1000 mL via INTRAVENOUS

## 2013-03-05 MED ORDER — IOHEXOL 300 MG/ML  SOLN
100.0000 mL | Freq: Once | INTRAMUSCULAR | Status: AC | PRN
  Administered 2013-03-05: 100 mL via INTRAVENOUS

## 2013-03-05 MED ORDER — SODIUM CHLORIDE 0.9 % IV SOLN
Freq: Once | INTRAVENOUS | Status: AC
  Administered 2013-03-05: 1000 mL via INTRAVENOUS

## 2013-03-05 MED ORDER — TETANUS-DIPHTH-ACELL PERTUSSIS 5-2.5-18.5 LF-MCG/0.5 IM SUSP
0.5000 mL | Freq: Once | INTRAMUSCULAR | Status: AC
  Administered 2013-03-05: 0.5 mL via INTRAMUSCULAR
  Filled 2013-03-05: qty 0.5

## 2013-03-05 NOTE — ED Notes (Signed)
Patient returned from xray.
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
## 2013-03-05 NOTE — ED Notes (Signed)
Pt resting.  VS wnl.

## 2013-03-05 NOTE — ED Notes (Signed)
Patient to xray.

## 2013-03-05 NOTE — ED Notes (Signed)
Patient complaining of headache 7/10.  Patient also states that right wrist is painful at this time.  Patient has recently broken, recently came out of cast and has been doing PT.  Patient states that his pain is 3/10 in wrist and it doesn't feel right.  MD notified.
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
## 2013-03-05 NOTE — ED Notes (Signed)
Ortho paged. 
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
## 2013-03-05 NOTE — ED Notes (Signed)
Ortho at bedside.

## 2013-03-05 NOTE — Progress Notes (Signed)
Orthopedic Tech Progress Note Patient Details:  Helayne SeminoleJerry Heyde July 13, 1965 [id number] sugartong splint applied to Right UE. Application tolerated well. Care instructions explained. Ortho Devices Type of Ortho Device: Ace wrap;Sugartong splint Ortho Device/Splint Location: Right UE Ortho Device/Splint Interventions: Application   Asia R Thompson 03/05/2013, 7:55 AM

## 2013-03-05 NOTE — ED Notes (Signed)
[name], wife.  [phone] (H) or [phone] (C).
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
## 2013-03-17 ENCOUNTER — Emergency Department (HOSPITAL_COMMUNITY)
Admission: EM | Admit: 2013-03-17 | Discharge: 2013-03-17 | Disposition: A | Discharge status: Home / Self Care | Payer: Medicare Other | Attending: Emergency Medicine | Admitting: Emergency Medicine

## 2013-03-17 ENCOUNTER — Encounter (HOSPITAL_COMMUNITY): Payer: Self-pay | Admitting: Emergency Medicine

## 2013-03-17 DIAGNOSIS — I10 Essential (primary) hypertension: Secondary | ICD-10-CM | POA: Insufficient documentation

## 2013-03-17 DIAGNOSIS — Z862 Personal history of diseases of the blood and blood-forming organs and certain disorders involving the immune mechanism: Secondary | ICD-10-CM | POA: Insufficient documentation

## 2013-03-17 DIAGNOSIS — Z4802 Encounter for removal of sutures: Secondary | ICD-10-CM | POA: Insufficient documentation

## 2013-03-17 DIAGNOSIS — Z792 Long term (current) use of antibiotics: Secondary | ICD-10-CM | POA: Insufficient documentation

## 2013-03-17 DIAGNOSIS — F17200 Nicotine dependence, unspecified, uncomplicated: Secondary | ICD-10-CM | POA: Insufficient documentation

## 2013-03-17 DIAGNOSIS — I2510 Atherosclerotic heart disease of native coronary artery without angina pectoris: Secondary | ICD-10-CM | POA: Insufficient documentation

## 2013-03-17 DIAGNOSIS — Z79899 Other long term (current) drug therapy: Secondary | ICD-10-CM | POA: Insufficient documentation

## 2013-03-17 DIAGNOSIS — Z7982 Long term (current) use of aspirin: Secondary | ICD-10-CM | POA: Insufficient documentation

## 2013-03-17 DIAGNOSIS — Z8639 Personal history of other endocrine, nutritional and metabolic disease: Secondary | ICD-10-CM | POA: Insufficient documentation

## 2013-03-17 NOTE — ED Notes (Signed)
All sutures removed, one long running suture, and at the end of the laceration, wound cleaned, pt tolerated well, no additional sutures, bleeding noted,
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
## 2013-03-17 NOTE — ED Provider Notes (Signed)
Medical screening examination/treatment/procedure(s) were performed by non-physician practitioner and as supervising physician I was immediately available for consultation/collaboration.   EKG Interpretation None        [place] Nol[place] W Averianna Brugger, MD 03/17/13 1644 
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
## 2013-03-17 NOTE — Discharge Instructions (Signed)
Suture Removal, Care After Refer to this sheet in the next few weeks. These instructions provide you with information on caring for yourself after your procedure. Your health care provider may also give you more specific instructions. Your treatment has been planned according to current medical practices, but problems sometimes occur. Call your health care provider if you have any problems or questions after your procedure. WHAT TO EXPECT AFTER THE PROCEDURE After your stitches (sutures) are removed, it is typical to have the following:  Some discomfort and swelling in the wound area.  Slight redness in the area. HOME CARE INSTRUCTIONS   If you have skin adhesive strips over the wound area, do not take the strips off. They will fall off on their own in a few days. If the strips remain in place after 14 days, you may remove them.  Change any bandages (dressings) at least once a day or as directed by your health care provider. If the bandage sticks, soak it off with warm, soapy water.  Apply cream or ointment only as directed by your health care provider. If using cream or ointment, wash the area with soap and water 2 times a day to remove all the cream or ointment. Rinse off the soap and pat the area dry with a clean towel.  Keep the wound area dry and clean. If the bandage becomes wet or dirty, or if it develops a bad smell, change it as soon as possible.  Continue to protect the wound from injury.  Use sunscreen when out in the sun. New scars become sunburned easily. SEEK MEDICAL CARE IF:  You have increasing redness, swelling, or pain in the wound.  You see pus coming from the wound.  You have a fever.  You notice a bad smell coming from the wound or dressing.  Your wound breaks open (edges not staying together). Document Released: 09/12/2000 Document Revised: 10/08/2012 Document Reviewed: 07/30/2012 [employer] Patient Information 2014 [employer].  

## 2013-04-24 ENCOUNTER — Other Ambulatory Visit: Payer: Self-pay | Admitting: Cardiology

## 2013-06-01 ENCOUNTER — Other Ambulatory Visit: Payer: Self-pay | Admitting: Cardiology

## 2013-10-21 ENCOUNTER — Other Ambulatory Visit: Payer: Self-pay | Admitting: *Deleted

## 2013-10-21 MED ORDER — METOPROLOL SUCCINATE ER 25 MG PO TB24: 25.0000 mg | ORAL_TABLET | Freq: Every day | ORAL | Status: DC

## 2014-01-29 ENCOUNTER — Ambulatory Visit (INDEPENDENT_AMBULATORY_CARE_PROVIDER_SITE_OTHER): Payer: Medicare Other | Admitting: Cardiology

## 2014-01-29 ENCOUNTER — Encounter: Payer: Self-pay | Admitting: Cardiology

## 2014-01-29 VITALS — BP 152/80 | HR 65 | Ht 72.0 in | Wt 230.0 lb

## 2014-01-29 DIAGNOSIS — I25810 Atherosclerosis of coronary artery bypass graft(s) without angina pectoris: Secondary | ICD-10-CM

## 2014-01-29 DIAGNOSIS — E785 Hyperlipidemia, unspecified: Secondary | ICD-10-CM

## 2014-01-29 NOTE — Assessment & Plan Note (Addendum)
Patient had a follow-up carotid Doppler January, 2015. He has mild disease. He can have a two-year follow-up..Marland Kitchen

## 2014-01-29 NOTE — Assessment & Plan Note (Signed)
Is on guy like directed therapy. No change at this time.

## 2014-01-29 NOTE — Assessment & Plan Note (Signed)
The patient's coronary disease is stable. Last cath was 2012. Grafts are patent. He had significant disease of the PDA that was not amenable to PCI. He is not having any significant symptoms. I will continue aspirin plus Plavix. He is on guideline directed medical therapy. No change in therapy.

## 2014-01-29 NOTE — Patient Instructions (Signed)
Your physician wants you to follow-up in: 1 year with our team of cardiologist You will receive a reminder letter in the mail two months in advance. If you don't receive a letter, please call our office to schedule the follow-up appointment.  Your physician recommends that you continue on your current medications as directed. Please refer to the Current Medication list given to you today.  Thank you for choosing [facility]!!
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
## 2014-02-04 ENCOUNTER — Other Ambulatory Visit: Payer: Self-pay | Admitting: Cardiology

## 2014-03-03 ENCOUNTER — Telehealth: Payer: Self-pay | Admitting: Cardiology

## 2014-03-03 NOTE — Telephone Encounter (Signed)
Called [name] in reference to 1 yr fu cartoid dopper per [facility] reeport. Last study done on 01-21-2013  No answer. Will mail a recall letter.

## 2014-03-16 ENCOUNTER — Emergency Department (HOSPITAL_COMMUNITY)
Admission: EM | Admit: 2014-03-16 | Discharge: 2014-03-16 | Disposition: A | Discharge status: Home / Self Care | Payer: Medicare Other | Attending: Emergency Medicine | Admitting: Emergency Medicine

## 2014-03-16 ENCOUNTER — Emergency Department (HOSPITAL_COMMUNITY): Payer: Medicare Other

## 2014-03-16 ENCOUNTER — Encounter (HOSPITAL_COMMUNITY): Payer: Self-pay | Admitting: Emergency Medicine

## 2014-03-16 DIAGNOSIS — I739 Peripheral vascular disease, unspecified: Secondary | ICD-10-CM | POA: Diagnosis not present

## 2014-03-16 DIAGNOSIS — K088 Other specified disorders of teeth and supporting structures: Secondary | ICD-10-CM | POA: Diagnosis present

## 2014-03-16 DIAGNOSIS — K047 Periapical abscess without sinus: Secondary | ICD-10-CM | POA: Diagnosis not present

## 2014-03-16 DIAGNOSIS — Z792 Long term (current) use of antibiotics: Secondary | ICD-10-CM | POA: Insufficient documentation

## 2014-03-16 DIAGNOSIS — Z7951 Long term (current) use of inhaled steroids: Secondary | ICD-10-CM | POA: Insufficient documentation

## 2014-03-16 DIAGNOSIS — Z79899 Other long term (current) drug therapy: Secondary | ICD-10-CM | POA: Insufficient documentation

## 2014-03-16 DIAGNOSIS — Z89611 Acquired absence of right leg above knee: Secondary | ICD-10-CM | POA: Insufficient documentation

## 2014-03-16 DIAGNOSIS — I10 Essential (primary) hypertension: Secondary | ICD-10-CM | POA: Insufficient documentation

## 2014-03-16 DIAGNOSIS — Z87891 Personal history of nicotine dependence: Secondary | ICD-10-CM | POA: Diagnosis not present

## 2014-03-16 DIAGNOSIS — E785 Hyperlipidemia, unspecified: Secondary | ICD-10-CM | POA: Insufficient documentation

## 2014-03-16 DIAGNOSIS — Z7902 Long term (current) use of antithrombotics/antiplatelets: Secondary | ICD-10-CM | POA: Insufficient documentation

## 2014-03-16 DIAGNOSIS — I2510 Atherosclerotic heart disease of native coronary artery without angina pectoris: Secondary | ICD-10-CM | POA: Insufficient documentation

## 2014-03-16 DIAGNOSIS — Z98811 Dental restoration status: Secondary | ICD-10-CM | POA: Insufficient documentation

## 2014-03-16 DIAGNOSIS — L03211 Cellulitis of face: Secondary | ICD-10-CM | POA: Diagnosis not present

## 2014-03-16 DIAGNOSIS — Z7982 Long term (current) use of aspirin: Secondary | ICD-10-CM | POA: Insufficient documentation

## 2014-03-16 DIAGNOSIS — Z951 Presence of aortocoronary bypass graft: Secondary | ICD-10-CM | POA: Diagnosis not present

## 2014-03-16 LAB — CBC WITH DIFFERENTIAL/PLATELET
Basophils Absolute: 0.1 10*3/uL (ref 0.0–0.1)
Basophils Relative: 1 % (ref 0–1)
EOS PCT: 1 % (ref 0–5)
Eosinophils Absolute: 0.1 10*3/uL (ref 0.0–0.7)
HCT: 53.4 % — ABNORMAL HIGH (ref 39.0–52.0)
Hemoglobin: 19 g/dL — ABNORMAL HIGH (ref 13.0–17.0)
LYMPHS PCT: 21 % (ref 12–46)
Lymphs Abs: 2 10*3/uL (ref 0.7–4.0)
MCH: 33.7 pg (ref 26.0–34.0)
MCHC: 35.6 g/dL (ref 30.0–36.0)
MCV: 94.7 fL (ref 78.0–100.0)
Monocytes Absolute: 0.9 10*3/uL (ref 0.1–1.0)
Monocytes Relative: 9 % (ref 3–12)
NEUTROS ABS: 6.5 10*3/uL (ref 1.7–7.7)
Neutrophils Relative %: 68 % (ref 43–77)
PLATELETS: 142 10*3/uL — AB (ref 150–400)
RBC: 5.64 MIL/uL (ref 4.22–5.81)
RDW: 12.9 % (ref 11.5–15.5)
WBC: 9.5 10*3/uL (ref 4.0–10.5)

## 2014-03-16 LAB — I-STAT CHEM 8, ED
BUN: 5 mg/dL — AB (ref 6–23)
Calcium, Ion: 1.08 mmol/L — ABNORMAL LOW (ref 1.12–1.23)
Chloride: 99 mmol/L (ref 96–112)
Creatinine, Ser: 0.8 mg/dL (ref 0.50–1.35)
GLUCOSE: 123 mg/dL — AB (ref 70–99)
HCT: 56 % — ABNORMAL HIGH (ref 39.0–52.0)
Hemoglobin: 19 g/dL — ABNORMAL HIGH (ref 13.0–17.0)
Potassium: 3.9 mmol/L (ref 3.5–5.1)
SODIUM: 137 mmol/L (ref 135–145)
TCO2: 21 mmol/L (ref 0–100)

## 2014-03-16 MED ORDER — CLINDAMYCIN PHOSPHATE 600 MG/50ML IV SOLN
600.0000 mg | Freq: Once | INTRAVENOUS | Status: AC
  Administered 2014-03-16: 600 mg via INTRAVENOUS
  Filled 2014-03-16: qty 50

## 2014-03-16 MED ORDER — SODIUM CHLORIDE 0.9 % IV BOLUS (SEPSIS)
1000.0000 mL | Freq: Once | INTRAVENOUS | Status: AC
  Administered 2014-03-16: 1000 mL via INTRAVENOUS

## 2014-03-16 MED ORDER — OXYCODONE-ACETAMINOPHEN 5-325 MG PO TABS
1.0000 | ORAL_TABLET | ORAL | Status: DC | PRN
Start: 2014-03-16 — End: 2016-02-03

## 2014-03-16 MED ORDER — CLINDAMYCIN HCL 300 MG PO CAPS: 300.0000 mg | ORAL_CAPSULE | Freq: Four times a day (QID) | ORAL | Status: DC

## 2014-03-16 MED ORDER — MORPHINE SULFATE 4 MG/ML IJ SOLN
4.0000 mg | Freq: Once | INTRAMUSCULAR | Status: AC
  Administered 2014-03-16: 4 mg via INTRAVENOUS
  Filled 2014-03-16: qty 1

## 2014-03-16 MED ORDER — IOHEXOL 300 MG/ML  SOLN
80.0000 mL | Freq: Once | INTRAMUSCULAR | Status: AC | PRN
  Administered 2014-03-16: 80 mL via INTRAVENOUS

## 2014-03-16 NOTE — Discharge Instructions (Signed)
Read the information below.  Use the prescribed medication as directed.  Please discuss all new medications with your pharmacist.  Do not take additional tylenol while taking the prescribed pain medication to avoid overdose.  You may return to the Emergency Department at any time for worsening condition or any new symptoms that concern you.   Please call the dentist listed above within 48 hours to schedule a close follow up appointment.  If you develop difficulty swallowing or breathing, return to the ER immediately for a recheck.     Abscessed Tooth An abscessed tooth is an infection around your tooth. It may be caused by holes or damage to the tooth (cavity) or a dental disease. An abscessed tooth causes mild to very bad pain in and around the tooth. See your dentist right away if you have tooth or gum pain. HOME CARE  Take your medicine as told. Finish it even if you start to feel better.  Do not drive after taking pain medicine.  Rinse your mouth (gargle) often with salt water ( teaspoon salt in 8 ounces of warm water).  Do not apply heat to the outside of your face. GET HELP RIGHT AWAY IF:   You have a temperature by mouth above 102 F (38.9 C), not controlled by medicine.  You have chills and a very bad headache.  You have problems breathing or swallowing.  Your mouth will not open.  You develop puffiness (swelling) on the neck or around the eye.  Your pain is not helped by medicine.  Your pain is getting worse instead of better. MAKE SURE YOU:   Understand these instructions.  Will watch your condition.  Will get help right away if you are not doing well or get worse. Document Released: 06/06/2007 Document Revised: 03/12/2011 Document Reviewed: 03/28/2010 [facility] Patient Information 2015 [place] [place]. This information is not intended to replace advice given to you by your health care provider. Make sure you discuss any questions you have with your health care  provider.   Cellulitis Cellulitis is an infection of the skin and the tissue beneath it. The infected area is usually red and tender. Cellulitis occurs most often in the arms and lower legs.  CAUSES  Cellulitis is caused by bacteria that enter the skin through cracks or cuts in the skin. The most common types of bacteria that cause cellulitis are staphylococci and streptococci. SIGNS AND SYMPTOMS   Redness and warmth.  Swelling.  Tenderness or pain.  Fever. DIAGNOSIS  Your health care provider can usually determine what is wrong based on a physical exam. Blood tests may also be done. TREATMENT  Treatment usually involves taking an antibiotic medicine. HOME CARE INSTRUCTIONS   Take your antibiotic medicine as directed by your health care provider. Finish the antibiotic even if you start to feel better.  Keep the infected arm or leg elevated to reduce swelling.  Apply a warm cloth to the affected area up to 4 times per day to relieve pain.  Take medicines only as directed by your health care provider.  Keep all follow-up visits as directed by your health care provider. SEEK MEDICAL CARE IF:   You notice red streaks coming from the infected area.  Your red area gets larger or turns dark in color.  Your bone or joint underneath the infected area becomes painful after the skin has healed.  Your infection returns in the same area or another area.  You notice a swollen bump in the infected  area.  You develop new symptoms.  You have a fever. SEEK IMMEDIATE MEDICAL CARE IF:   You feel very sleepy.  You develop vomiting or diarrhea.  You have a general ill feeling (malaise) with muscle aches and pains. MAKE SURE YOU:   Understand these instructions.  Will watch your condition.  Will get help right away if you are not doing well or get worse. Document Released: 09/27/2004 Document Revised: 05/04/2013 Document Reviewed: 03/05/2011 [facility] Patient Information 2015  [place], [place]. This information is not intended to replace advice given to you by your health care provider. Make sure you discuss any questions you have with your health care provider.

## 2014-03-16 NOTE — ED Notes (Signed)
Pt c/o left sided dental pain with swelling into left side of face; pt sts filling fell out 3 days ago
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
## 2014-03-16 NOTE — ED Notes (Signed)
Pt is in stable condition upon d/c and ambulates from ED. 

## 2014-03-16 NOTE — ED Notes (Signed)
Paged Dr. Jeanice Limurham

## 2014-04-12 ENCOUNTER — Encounter: Payer: Self-pay | Admitting: Cardiovascular Disease

## 2014-05-12 ENCOUNTER — Other Ambulatory Visit: Payer: Self-pay | Admitting: Cardiology

## 2014-05-13 ENCOUNTER — Other Ambulatory Visit: Payer: Self-pay | Admitting: Cardiology

## 2014-06-10 ENCOUNTER — Other Ambulatory Visit: Payer: Self-pay | Admitting: Cardiology

## 2014-09-22 ENCOUNTER — Other Ambulatory Visit: Payer: Self-pay | Admitting: Cardiology

## 2014-09-22 MED ORDER — CLOPIDOGREL BISULFATE 75 MG PO TABS: 75.0000 mg | ORAL_TABLET | Freq: Every day | ORAL | Status: DC

## 2014-12-21 ENCOUNTER — Emergency Department (HOSPITAL_COMMUNITY)
Admission: EM | Admit: 2014-12-21 | Discharge: 2014-12-21 | Disposition: A | Discharge status: Home / Self Care | Payer: Medicare Other | Attending: Emergency Medicine | Admitting: Emergency Medicine

## 2014-12-21 ENCOUNTER — Encounter (HOSPITAL_COMMUNITY): Payer: Self-pay | Admitting: Emergency Medicine

## 2014-12-21 DIAGNOSIS — Z7951 Long term (current) use of inhaled steroids: Secondary | ICD-10-CM | POA: Insufficient documentation

## 2014-12-21 DIAGNOSIS — Z87891 Personal history of nicotine dependence: Secondary | ICD-10-CM | POA: Insufficient documentation

## 2014-12-21 DIAGNOSIS — Z872 Personal history of diseases of the skin and subcutaneous tissue: Secondary | ICD-10-CM | POA: Diagnosis not present

## 2014-12-21 DIAGNOSIS — Z79899 Other long term (current) drug therapy: Secondary | ICD-10-CM | POA: Diagnosis not present

## 2014-12-21 DIAGNOSIS — E785 Hyperlipidemia, unspecified: Secondary | ICD-10-CM | POA: Insufficient documentation

## 2014-12-21 DIAGNOSIS — Z792 Long term (current) use of antibiotics: Secondary | ICD-10-CM | POA: Insufficient documentation

## 2014-12-21 DIAGNOSIS — I10 Essential (primary) hypertension: Secondary | ICD-10-CM | POA: Insufficient documentation

## 2014-12-21 DIAGNOSIS — H66012 Acute suppurative otitis media with spontaneous rupture of ear drum, left ear: Secondary | ICD-10-CM

## 2014-12-21 DIAGNOSIS — Z7982 Long term (current) use of aspirin: Secondary | ICD-10-CM | POA: Insufficient documentation

## 2014-12-21 DIAGNOSIS — J069 Acute upper respiratory infection, unspecified: Secondary | ICD-10-CM | POA: Insufficient documentation

## 2014-12-21 DIAGNOSIS — I2510 Atherosclerotic heart disease of native coronary artery without angina pectoris: Secondary | ICD-10-CM | POA: Insufficient documentation

## 2014-12-21 DIAGNOSIS — Z951 Presence of aortocoronary bypass graft: Secondary | ICD-10-CM | POA: Insufficient documentation

## 2014-12-21 DIAGNOSIS — H9202 Otalgia, left ear: Secondary | ICD-10-CM | POA: Diagnosis present

## 2014-12-21 DIAGNOSIS — Z7902 Long term (current) use of antithrombotics/antiplatelets: Secondary | ICD-10-CM | POA: Diagnosis not present

## 2014-12-21 MED ORDER — AMOXICILLIN 250 MG PO CAPS
500.0000 mg | ORAL_CAPSULE | Freq: Once | ORAL | Status: AC
  Administered 2014-12-21: 500 mg via ORAL
  Filled 2014-12-21: qty 2

## 2014-12-21 MED ORDER — AMOXICILLIN 500 MG PO CAPS: 500.0000 mg | ORAL_CAPSULE | Freq: Three times a day (TID) | ORAL | Status: DC

## 2014-12-21 NOTE — ED Notes (Signed)
Having cold symptoms for last 2 days.  C/o pain to left ear, rates pain 6/10.  C/o nasal congestion.

## 2014-12-21 NOTE — Discharge Instructions (Signed)
Otitis Media, Adult Otitis media is redness, soreness, and inflammation of the middle ear. Otitis media may be caused by allergies or, most commonly, by infection. Often it occurs as a complication of the common cold. SIGNS AND SYMPTOMS Symptoms of otitis media may include:  Earache.  Fever.  Ringing in your ear.  Headache.  Leakage of fluid from the ear. DIAGNOSIS To diagnose otitis media, your health care provider will examine your ear with an otoscope. This is an instrument that allows your health care provider to see into your ear in order to examine your eardrum. Your health care provider also will ask you questions about your symptoms. TREATMENT  Typically, otitis media resolves on its own within 3-5 days. Your health care provider may prescribe medicine to ease your symptoms of pain. If otitis media does not resolve within 5 days or is recurrent, your health care provider may prescribe antibiotic medicines if he or she suspects that a bacterial infection is the cause. HOME CARE INSTRUCTIONS   If you were prescribed an antibiotic medicine, finish it all even if you start to feel better.  Take medicines only as directed by your health care provider.  Keep all follow-up visits as directed by your health care provider. SEEK MEDICAL CARE IF:  You have otitis media only in one ear, or bleeding from your nose, or both.  You notice a lump on your neck.  You are not getting better in 3-5 days.  You feel worse instead of better. SEEK IMMEDIATE MEDICAL CARE IF:   You have pain that is not controlled with medicine.  You have swelling, redness, or pain around your ear or stiffness in your neck.  You notice that part of your face is paralyzed.  You notice that the bone behind your ear (mastoid) is tender when you touch it. MAKE SURE YOU:   Understand these instructions.  Will watch your condition.  Will get help right away if you are not doing well or get worse.   This  information is not intended to replace advice given to you by your health care provider. Make sure you discuss any questions you have with your health care provider.   Document Released: 09/23/2003 Document Revised: 01/08/2014 Document Reviewed: 07/15/2012 Elsevier Interactive Patient Education 2016 [employer]   Avoid getting any water and do not put any liquid medicines in your left ear as discussed.

## 2014-12-21 NOTE — ED Notes (Signed)
Patient with no complaints at this time. Respirations even and unlabored. Skin warm/dry. Discharge instructions reviewed with patient at this time. Patient given opportunity to voice concerns/ask questions. Patient discharged at this time and left Emergency Department with steady gait.   
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
## 2014-12-29 ENCOUNTER — Other Ambulatory Visit: Payer: Self-pay | Admitting: Cardiology

## 2014-12-29 ENCOUNTER — Other Ambulatory Visit: Payer: Self-pay | Admitting: Cardiovascular Disease

## 2014-12-29 DIAGNOSIS — I6523 Occlusion and stenosis of bilateral carotid arteries: Secondary | ICD-10-CM

## 2015-01-25 ENCOUNTER — Other Ambulatory Visit: Payer: Self-pay | Admitting: Cardiology

## 2015-01-27 ENCOUNTER — Ambulatory Visit: Payer: Medicare Other

## 2015-01-27 DIAGNOSIS — I6523 Occlusion and stenosis of bilateral carotid arteries: Secondary | ICD-10-CM

## 2015-01-28 ENCOUNTER — Encounter: Payer: Self-pay | Admitting: *Deleted

## 2015-01-31 ENCOUNTER — Encounter: Payer: Self-pay | Admitting: Cardiovascular Disease

## 2015-01-31 ENCOUNTER — Telehealth: Payer: Self-pay | Admitting: *Deleted

## 2015-01-31 ENCOUNTER — Ambulatory Visit (INDEPENDENT_AMBULATORY_CARE_PROVIDER_SITE_OTHER): Payer: Medicare Other | Admitting: Cardiovascular Disease

## 2015-01-31 VITALS — BP 158/92 | HR 62 | Ht 73.0 in | Wt 235.0 lb

## 2015-01-31 DIAGNOSIS — I25708 Atherosclerosis of coronary artery bypass graft(s), unspecified, with other forms of angina pectoris: Secondary | ICD-10-CM

## 2015-01-31 DIAGNOSIS — E785 Hyperlipidemia, unspecified: Secondary | ICD-10-CM | POA: Diagnosis not present

## 2015-01-31 DIAGNOSIS — I10 Essential (primary) hypertension: Secondary | ICD-10-CM

## 2015-01-31 DIAGNOSIS — I739 Peripheral vascular disease, unspecified: Secondary | ICD-10-CM

## 2015-01-31 DIAGNOSIS — I6523 Occlusion and stenosis of bilateral carotid arteries: Secondary | ICD-10-CM

## 2015-01-31 DIAGNOSIS — I779 Disorder of arteries and arterioles, unspecified: Secondary | ICD-10-CM

## 2015-01-31 MED ORDER — AMLODIPINE BESYLATE 5 MG PO TABS: 5.0000 mg | ORAL_TABLET | Freq: Every day | ORAL | Status: DC

## 2015-01-31 MED ORDER — NITROGLYCERIN 0.4 MG SL SUBL: SUBLINGUAL_TABLET | SUBLINGUAL | Status: DC

## 2015-01-31 NOTE — Patient Instructions (Signed)
   Begin Amlodipine  daily - new sent to [facility] today. Continue all other medications.   Your physician wants you to follow up in:  1 year.  You will receive a reminder letter in the mail one-two months in advance.  If you don't receive a letter, please call our office to schedule the follow up appointment

## 2015-01-31 NOTE — Addendum Note (Signed)
Addended by: Lesle Chris on: 01/31/2015 02:15 PM   Modules accepted: Orders
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
## 2015-01-31 NOTE — Telephone Encounter (Signed)
Pt aware, appt today at 2 pm, recall placed for 2 year carotid US, routed to pcp

## 2015-01-31 NOTE — Telephone Encounter (Signed)
-----   Message from Suresh A Koneswaran, MD sent at 01/28/2015  5:27 PM EST ----- Very mild blockages. Repeat in 2 years. 

## 2015-02-03 ENCOUNTER — Other Ambulatory Visit: Payer: Self-pay | Admitting: Cardiovascular Disease

## 2015-02-03 MED ORDER — AMLODIPINE BESYLATE 5 MG PO TABS: 5.0000 mg | ORAL_TABLET | Freq: Every day | ORAL | Status: DC

## 2015-02-03 NOTE — Telephone Encounter (Signed)
amLODipine (NORVASC) 5 MG tablet  Sent to wrong pharmacy. Should go to [facility].

## 2015-02-03 NOTE — Telephone Encounter (Signed)
Medication sent to [facility] as requested

## 2015-02-08 ENCOUNTER — Encounter: Payer: Self-pay | Admitting: *Deleted

## 2015-02-10 ENCOUNTER — Telehealth: Payer: Self-pay | Admitting: *Deleted

## 2015-02-10 ENCOUNTER — Encounter: Payer: Self-pay | Admitting: *Deleted

## 2015-02-10 NOTE — Telephone Encounter (Signed)
Patient in office on 01/31/2015 - MD requested Lipids from his pmd.    See care everywhere for results.

## 2015-02-16 ENCOUNTER — Telehealth: Payer: Self-pay | Admitting: Cardiovascular Disease

## 2015-02-16 NOTE — Telephone Encounter (Signed)
[name] from [facility] called per Dr. Beverly Sessions.  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  Needs to have surgery on 02/22/15. Dr. Mardene Speak is asking about patient coming off Plavix.  Please call [name] at [phone]

## 2015-02-16 NOTE — Telephone Encounter (Signed)
Can stop Plavix 3-5 days prior.

## 2015-02-17 NOTE — Telephone Encounter (Signed)
Returned call to [place] (NP) at [facility] - left information on her voice mail as her message stated she was out of office today.  Asked her to call back to give [place] a fax number if she needed note for documentation.

## 2015-02-24 ENCOUNTER — Other Ambulatory Visit: Payer: Self-pay | Admitting: Cardiology

## 2015-03-25 ENCOUNTER — Other Ambulatory Visit: Payer: Self-pay | Admitting: Cardiology

## 2015-04-20 ENCOUNTER — Other Ambulatory Visit: Payer: Self-pay | Admitting: Cardiovascular Disease

## 2015-04-20 ENCOUNTER — Other Ambulatory Visit: Payer: Self-pay | Admitting: Cardiology

## 2015-05-20 ENCOUNTER — Other Ambulatory Visit: Payer: Self-pay | Admitting: Cardiology

## 2015-10-06 ENCOUNTER — Other Ambulatory Visit: Payer: Self-pay | Admitting: Cardiovascular Disease

## 2015-11-04 ENCOUNTER — Other Ambulatory Visit: Payer: Self-pay | Admitting: Cardiovascular Disease

## 2016-01-26 ENCOUNTER — Other Ambulatory Visit: Payer: Self-pay | Admitting: Cardiovascular Disease

## 2016-02-03 ENCOUNTER — Ambulatory Visit (INDEPENDENT_AMBULATORY_CARE_PROVIDER_SITE_OTHER): Payer: Medicare Other | Admitting: Cardiovascular Disease

## 2016-02-03 ENCOUNTER — Encounter: Payer: Self-pay | Admitting: *Deleted

## 2016-02-03 VITALS — BP 125/70 | HR 67 | Ht 73.0 in | Wt 234.0 lb

## 2016-02-03 DIAGNOSIS — E785 Hyperlipidemia, unspecified: Secondary | ICD-10-CM

## 2016-02-03 DIAGNOSIS — I779 Disorder of arteries and arterioles, unspecified: Secondary | ICD-10-CM | POA: Diagnosis not present

## 2016-02-03 DIAGNOSIS — I25708 Atherosclerosis of coronary artery bypass graft(s), unspecified, with other forms of angina pectoris: Secondary | ICD-10-CM | POA: Diagnosis not present

## 2016-02-03 DIAGNOSIS — I209 Angina pectoris, unspecified: Secondary | ICD-10-CM

## 2016-02-03 DIAGNOSIS — I10 Essential (primary) hypertension: Secondary | ICD-10-CM

## 2016-02-03 DIAGNOSIS — I739 Peripheral vascular disease, unspecified: Secondary | ICD-10-CM

## 2016-02-03 NOTE — Patient Instructions (Signed)
Your physician wants you to follow-up in: 1 year with Dr. Koneswaran.  You will receive a reminder letter in the mail two months in advance. If you don't receive a letter, please call our office to schedule the follow-up appointment.  Your physician recommends that you continue on your current medications as directed. Please refer to the Current Medication list given to you today.  Thank you for choosing [facility]!   
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
## 2016-02-21 ENCOUNTER — Other Ambulatory Visit: Payer: Self-pay | Admitting: Cardiovascular Disease

## 2016-03-30 ENCOUNTER — Other Ambulatory Visit: Payer: Self-pay | Admitting: Cardiovascular Disease

## 2016-04-30 ENCOUNTER — Other Ambulatory Visit: Payer: Self-pay | Admitting: Cardiovascular Disease

## 2016-07-30 ENCOUNTER — Other Ambulatory Visit: Payer: Self-pay | Admitting: Cardiovascular Disease

## 2016-08-30 ENCOUNTER — Other Ambulatory Visit: Payer: Self-pay | Admitting: Cardiovascular Disease

## 2016-10-01 ENCOUNTER — Other Ambulatory Visit: Payer: Self-pay | Admitting: Cardiovascular Disease

## 2016-10-26 ENCOUNTER — Other Ambulatory Visit: Payer: Self-pay | Admitting: Cardiovascular Disease

## 2016-11-02 ENCOUNTER — Other Ambulatory Visit: Payer: Self-pay | Admitting: Cardiovascular Disease

## 2016-12-31 ENCOUNTER — Telehealth: Payer: Self-pay | Admitting: Cardiovascular Disease

## 2016-12-31 NOTE — Telephone Encounter (Signed)
[facility] Jan 30, 2017

## 2017-01-03 ENCOUNTER — Other Ambulatory Visit: Payer: Self-pay | Admitting: Cardiovascular Disease

## 2017-01-03 DIAGNOSIS — I6523 Occlusion and stenosis of bilateral carotid arteries: Secondary | ICD-10-CM

## 2017-01-26 ENCOUNTER — Other Ambulatory Visit: Payer: Self-pay | Admitting: Cardiovascular Disease

## 2017-02-12 ENCOUNTER — Ambulatory Visit: Payer: Self-pay | Admitting: Cardiovascular Disease

## 2017-02-13 ENCOUNTER — Ambulatory Visit (INDEPENDENT_AMBULATORY_CARE_PROVIDER_SITE_OTHER): Payer: Medicare Other

## 2017-02-13 DIAGNOSIS — I6523 Occlusion and stenosis of bilateral carotid arteries: Secondary | ICD-10-CM

## 2017-02-15 LAB — VAS US CAROTID
LCCADDIAS: -13 cm/s
LCCAPSYS: 107 cm/s
LEFT ECA DIAS: -12 cm/s
LEFT VERTEBRAL DIAS: 17 cm/s
Left CCA dist sys: -69 cm/s
Left CCA prox dias: 14 cm/s
Left ICA dist dias: -16 cm/s
Left ICA dist sys: -67 cm/s
RCCADSYS: -75 cm/s
RIGHT ECA DIAS: -11 cm/s
RIGHT VERTEBRAL DIAS: 15 cm/s
Right CCA prox dias: 0 cm/s
Right CCA prox sys: 86 cm/s

## 2017-02-19 ENCOUNTER — Telehealth: Payer: Self-pay | Admitting: *Deleted

## 2017-02-19 NOTE — Telephone Encounter (Signed)
Notes recorded by Lesle ChrisHill, Angela G, LPN on 4/69/62952/19/2019 at 9:58 AM EST Patient notified. Copy to pmd. Follow up scheduled for 03/12/2017 with Dr. Purvis SheffieldKoneswaran.   ------  Notes recorded by Laqueta LindenKoneswaran, Suresh A, MD on 02/18/2017 at 11:07 AM EST Mild blockages. Can be repeated in 3 years.

## 2017-02-28 ENCOUNTER — Other Ambulatory Visit: Payer: Self-pay | Admitting: Cardiovascular Disease

## 2017-03-12 ENCOUNTER — Encounter: Payer: Self-pay | Admitting: Cardiovascular Disease

## 2017-03-12 ENCOUNTER — Other Ambulatory Visit: Payer: Self-pay

## 2017-03-12 ENCOUNTER — Ambulatory Visit (INDEPENDENT_AMBULATORY_CARE_PROVIDER_SITE_OTHER): Payer: Medicare Other | Admitting: Cardiovascular Disease

## 2017-03-12 VITALS — BP 118/71 | HR 53 | Ht 72.0 in | Wt 218.0 lb

## 2017-03-12 DIAGNOSIS — I6523 Occlusion and stenosis of bilateral carotid arteries: Secondary | ICD-10-CM

## 2017-03-12 DIAGNOSIS — I779 Disorder of arteries and arterioles, unspecified: Secondary | ICD-10-CM

## 2017-03-12 DIAGNOSIS — I25708 Atherosclerosis of coronary artery bypass graft(s), unspecified, with other forms of angina pectoris: Secondary | ICD-10-CM

## 2017-03-12 DIAGNOSIS — E785 Hyperlipidemia, unspecified: Secondary | ICD-10-CM | POA: Diagnosis not present

## 2017-03-12 DIAGNOSIS — I10 Essential (primary) hypertension: Secondary | ICD-10-CM

## 2017-03-12 DIAGNOSIS — I739 Peripheral vascular disease, unspecified: Secondary | ICD-10-CM

## 2017-03-12 MED ORDER — ATORVASTATIN CALCIUM 80 MG PO TABS: 80.0000 mg | ORAL_TABLET | Freq: Every day | ORAL | 1 refills | Status: DC
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
## 2017-03-12 NOTE — Patient Instructions (Signed)
Your physician wants you to follow-up in: 1 YEAR WITH DR Reggy EyeKONESWARAN You will receive a reminder letter in the mail two months in advance. If you don't receive a letter, please call our office to schedule the follow-up appointment.  Your physician has recommended you make the following change in your medication:   STOP PLAVIX  INCREASE ATORVASTATIN 80 MG DAILY  Thank you for choosing [facility]!!

## 2017-05-08 ENCOUNTER — Other Ambulatory Visit: Payer: Self-pay | Admitting: Cardiovascular Disease

## 2017-06-19 ENCOUNTER — Other Ambulatory Visit: Payer: Self-pay | Admitting: Cardiovascular Disease

## 2017-08-19 ENCOUNTER — Other Ambulatory Visit: Payer: Self-pay | Admitting: Cardiovascular Disease

## 2017-09-02 ENCOUNTER — Emergency Department (HOSPITAL_COMMUNITY): Payer: Medicare Other

## 2017-09-02 ENCOUNTER — Other Ambulatory Visit: Payer: Self-pay

## 2017-09-02 ENCOUNTER — Encounter (HOSPITAL_COMMUNITY): Payer: Self-pay | Admitting: Emergency Medicine

## 2017-09-02 ENCOUNTER — Emergency Department (HOSPITAL_COMMUNITY)
Admission: EM | Admit: 2017-09-02 | Discharge: 2017-09-03 | Disposition: A | Discharge status: Home / Self Care | Payer: Medicare Other | Attending: Emergency Medicine | Admitting: Emergency Medicine

## 2017-09-02 DIAGNOSIS — Z87891 Personal history of nicotine dependence: Secondary | ICD-10-CM | POA: Insufficient documentation

## 2017-09-02 DIAGNOSIS — R197 Diarrhea, unspecified: Secondary | ICD-10-CM | POA: Diagnosis not present

## 2017-09-02 DIAGNOSIS — Z7901 Long term (current) use of anticoagulants: Secondary | ICD-10-CM | POA: Insufficient documentation

## 2017-09-02 DIAGNOSIS — I10 Essential (primary) hypertension: Secondary | ICD-10-CM | POA: Insufficient documentation

## 2017-09-02 DIAGNOSIS — I2510 Atherosclerotic heart disease of native coronary artery without angina pectoris: Secondary | ICD-10-CM | POA: Insufficient documentation

## 2017-09-02 DIAGNOSIS — R109 Unspecified abdominal pain: Secondary | ICD-10-CM | POA: Diagnosis present

## 2017-09-02 DIAGNOSIS — Z7982 Long term (current) use of aspirin: Secondary | ICD-10-CM | POA: Insufficient documentation

## 2017-09-02 DIAGNOSIS — Z89611 Acquired absence of right leg above knee: Secondary | ICD-10-CM | POA: Diagnosis not present

## 2017-09-02 DIAGNOSIS — Z79899 Other long term (current) drug therapy: Secondary | ICD-10-CM | POA: Insufficient documentation

## 2017-09-02 LAB — COMPREHENSIVE METABOLIC PANEL
ALBUMIN: 3.5 g/dL (ref 3.5–5.0)
ALT: 37 U/L (ref 0–44)
AST: 48 U/L — AB (ref 15–41)
Alkaline Phosphatase: 91 U/L (ref 38–126)
Anion gap: 8 (ref 5–15)
BUN: 11 mg/dL (ref 6–20)
CALCIUM: 8.9 mg/dL (ref 8.9–10.3)
CHLORIDE: 100 mmol/L (ref 98–111)
CO2: 25 mmol/L (ref 22–32)
Creatinine, Ser: 1.11 mg/dL (ref 0.61–1.24)
GFR calc Af Amer: >60 mL/min (ref >60)
GFR calc non Af Amer: >60 mL/min (ref >60)
GLUCOSE: 122 mg/dL — AB (ref 70–99)
POTASSIUM: 3.5 mmol/L (ref 3.5–5.1)
SODIUM: 133 mmol/L — AB (ref 135–145)
TOTAL PROTEIN: 7.8 g/dL (ref 6.5–8.1)
Total Bilirubin: 2.2 mg/dL — ABNORMAL HIGH (ref 0.3–1.2)

## 2017-09-02 LAB — CBC WITH DIFFERENTIAL/PLATELET
Basophils Absolute: 0 10*3/uL (ref 0.0–0.1)
Basophils Relative: 0 %
EOS PCT: 1 %
Eosinophils Absolute: 0 10*3/uL (ref 0.0–0.7)
HCT: 45 % (ref 39.0–52.0)
Hemoglobin: 15.4 g/dL (ref 13.0–17.0)
LYMPHS ABS: 2.1 10*3/uL (ref 0.7–4.0)
LYMPHS PCT: 29 %
MCH: 33 pg (ref 26.0–34.0)
MCHC: 34.2 g/dL (ref 30.0–36.0)
MCV: 96.6 fL (ref 78.0–100.0)
MONO ABS: 0.5 10*3/uL (ref 0.1–1.0)
MONOS PCT: 7 %
Neutro Abs: 4.6 10*3/uL (ref 1.7–7.7)
Neutrophils Relative %: 63 %
PLATELETS: 73 10*3/uL — AB (ref 150–400)
RBC: 4.66 MIL/uL (ref 4.22–5.81)
RDW: 12.7 % (ref 11.5–15.5)
WBC: 7.2 10*3/uL (ref 4.0–10.5)

## 2017-09-02 LAB — URINALYSIS, ROUTINE W REFLEX MICROSCOPIC
Bacteria, UA: NONE SEEN
Bilirubin Urine: NEGATIVE
GLUCOSE, UA: NEGATIVE mg/dL
HGB URINE DIPSTICK: NEGATIVE
Ketones, ur: NEGATIVE mg/dL
LEUKOCYTES UA: NEGATIVE
NITRITE: NEGATIVE
Protein, ur: 30 mg/dL — AB
SPECIFIC GRAVITY, URINE: 1.023 (ref 1.005–1.030)
pH: 6 (ref 5.0–8.0)

## 2017-09-02 LAB — LIPASE, BLOOD: LIPASE: 45 U/L (ref 11–51)

## 2017-09-02 MED ORDER — SODIUM CHLORIDE 0.9 % IV BOLUS
1000.0000 mL | Freq: Once | INTRAVENOUS | Status: AC
  Administered 2017-09-03: 1000 mL via INTRAVENOUS

## 2017-09-02 MED ORDER — ONDANSETRON HCL 4 MG/2ML IJ SOLN
4.0000 mg | Freq: Once | INTRAMUSCULAR | Status: AC
  Administered 2017-09-03: 4 mg via INTRAVENOUS
  Filled 2017-09-02: qty 2
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
## 2017-09-02 NOTE — ED Triage Notes (Signed)
Patient complaining of abdominal pain with vomiting and diarrhea x 1 week.

## 2017-09-03 LAB — GASTROINTESTINAL PANEL BY PCR, STOOL (REPLACES STOOL CULTURE)

## 2017-09-03 LAB — C DIFFICILE QUICK SCREEN W PCR REFLEX
C DIFFICILE (CDIFF) INTERP: NOT DETECTED
C Diff antigen: NEGATIVE
C Diff toxin: NEGATIVE

## 2017-09-03 LAB — GROUP A STREP BY PCR: GROUP A STREP BY PCR: NOT DETECTED

## 2017-09-03 LAB — I-STAT CG4 LACTIC ACID, ED
Lactic Acid, Venous: 0.54 mmol/L (ref 0.5–1.9)
Lactic Acid, Venous: 1.33 mmol/L (ref 0.5–1.9)

## 2017-09-03 MED ORDER — IOPAMIDOL (ISOVUE-300) INJECTION 61%
100.0000 mL | Freq: Once | INTRAVENOUS | Status: AC | PRN
  Administered 2017-09-03: 100 mL via INTRAVENOUS

## 2017-09-03 MED ORDER — ONDANSETRON 4 MG PO TBDP: 4.0000 mg | ORAL_TABLET | Freq: Three times a day (TID) | ORAL | 0 refills | Status: DC | PRN

## 2017-09-03 NOTE — ED Notes (Signed)
Patient drinking ginger ale.

## 2017-09-03 NOTE — Discharge Instructions (Signed)
Follow-up with your doctor and gastroenterologist.  You may try Imodium for your diarrhea.  Your stool sample is pending and you will be recalled if the results show something that needs to be treated.  Return to the ED if you develop new or worsening symptoms.

## 2018-02-05 ENCOUNTER — Other Ambulatory Visit: Payer: Self-pay | Admitting: Radiology

## 2018-02-25 ENCOUNTER — Other Ambulatory Visit: Payer: Self-pay | Admitting: Cardiovascular Disease

## 2018-03-24 ENCOUNTER — Telehealth: Payer: Self-pay | Admitting: *Deleted

## 2018-03-24 NOTE — Telephone Encounter (Signed)
   Primary Cardiologist:  Dr. Purvis Sheffield  Patient contacted.  History reviewed.  No symptoms to suggest any unstable cardiac conditions.  Based on discussion, with current pandemic situation, we will be postponing this appointment till July 2020.  If symptoms change, he has been instructed to contact our office.     [name]

## 2018-03-25 ENCOUNTER — Ambulatory Visit: Payer: Medicare Other | Admitting: Cardiovascular Disease

## 2018-05-21 ENCOUNTER — Other Ambulatory Visit: Payer: Self-pay | Admitting: Cardiovascular Disease

## 2018-07-03 ENCOUNTER — Ambulatory Visit: Payer: Medicare Other | Admitting: Cardiovascular Disease

## 2018-07-03 ENCOUNTER — Encounter: Payer: Medicare Other | Admitting: Cardiovascular Disease

## 2018-07-03 ENCOUNTER — Encounter: Payer: Self-pay | Admitting: Cardiovascular Disease
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
## 2018-07-06 NOTE — Progress Notes (Signed)
This encounter was created in error - please disregard.

## 2018-08-14 ENCOUNTER — Other Ambulatory Visit: Payer: Self-pay | Admitting: Cardiovascular Disease

## 2018-08-15 ENCOUNTER — Encounter: Payer: Self-pay | Admitting: *Deleted

## 2018-08-15 ENCOUNTER — Telehealth: Payer: Self-pay | Admitting: *Deleted

## 2018-08-15 NOTE — Telephone Encounter (Signed)

## 2018-08-18 ENCOUNTER — Other Ambulatory Visit: Payer: Self-pay

## 2018-08-18 ENCOUNTER — Encounter: Payer: Self-pay | Admitting: Cardiovascular Disease

## 2018-08-18 ENCOUNTER — Ambulatory Visit (INDEPENDENT_AMBULATORY_CARE_PROVIDER_SITE_OTHER): Payer: Medicare Other | Admitting: Cardiovascular Disease

## 2018-08-18 VITALS — BP 122/78 | HR 87 | Ht 72.0 in | Wt 184.0 lb

## 2018-08-18 DIAGNOSIS — I10 Essential (primary) hypertension: Secondary | ICD-10-CM | POA: Diagnosis not present

## 2018-08-18 DIAGNOSIS — I779 Disorder of arteries and arterioles, unspecified: Secondary | ICD-10-CM

## 2018-08-18 DIAGNOSIS — M79605 Pain in left leg: Secondary | ICD-10-CM

## 2018-08-18 DIAGNOSIS — E785 Hyperlipidemia, unspecified: Secondary | ICD-10-CM | POA: Diagnosis not present

## 2018-08-18 DIAGNOSIS — I25708 Atherosclerosis of coronary artery bypass graft(s), unspecified, with other forms of angina pectoris: Secondary | ICD-10-CM

## 2018-08-18 DIAGNOSIS — I739 Peripheral vascular disease, unspecified: Secondary | ICD-10-CM
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
## 2018-08-18 NOTE — Patient Instructions (Addendum)
Medication Instructions:   Your physician recommends that you continue on your current medications as directed. Please refer to the Current Medication list given to you today.  Labwork:  NONE  Testing/Procedures:  Your physician has requested that you have a lower extremity arterial exercise duplex with ABI's. During this test, exercise and ultrasound are used to evaluate arterial blood flow in the legs. Allow one hour for this exam. There are no restrictions or special instructions.  Follow-Up:  Your physician recommends that you schedule a follow-up appointment in: 1 year. You will receive a reminder letter in the mail in about 10 months reminding you to call and schedule your appointment. If you don't receive this letter, please contact our office.  Any Other Special Instructions Will Be Listed Below (If Applicable).  If you need a refill on your cardiac medications before your next appointment, please call your pharmacy.

## 2018-08-21 ENCOUNTER — Other Ambulatory Visit: Payer: Self-pay | Admitting: Cardiovascular Disease

## 2018-08-21 DIAGNOSIS — I739 Peripheral vascular disease, unspecified: Secondary | ICD-10-CM

## 2018-09-11 ENCOUNTER — Other Ambulatory Visit: Payer: Self-pay

## 2018-09-11 ENCOUNTER — Ambulatory Visit (INDEPENDENT_AMBULATORY_CARE_PROVIDER_SITE_OTHER): Payer: Medicare Other

## 2018-09-11 ENCOUNTER — Other Ambulatory Visit: Payer: Self-pay | Admitting: Cardiovascular Disease

## 2018-09-11 DIAGNOSIS — I739 Peripheral vascular disease, unspecified: Secondary | ICD-10-CM

## 2018-09-18 ENCOUNTER — Telehealth: Payer: Self-pay | Admitting: *Deleted

## 2018-09-18 NOTE — Telephone Encounter (Signed)
Notes recorded by Laurine Blazer, LPN on 2/55/2589 at 4:83 PM EDT  Patient notified via detailed voice message. Copy to pmd.  ------   Notes recorded by Laurine Blazer, LPN on 4/75/8307 at 4:60 PM EDT  Left message to return call.   ------   Notes recorded by Herminio Commons, MD on 09/12/2018 at 11:59 AM EDT  No blockage in left leg.

## 2018-11-10 ENCOUNTER — Other Ambulatory Visit: Payer: Self-pay | Admitting: Cardiovascular Disease

## 2019-02-02 ENCOUNTER — Other Ambulatory Visit: Payer: Self-pay | Admitting: Cardiovascular Disease

## 2019-04-09 ENCOUNTER — Other Ambulatory Visit: Payer: Self-pay

## 2019-04-09 ENCOUNTER — Ambulatory Visit: Payer: Medicare Other | Attending: Family Medicine

## 2019-04-09 DIAGNOSIS — Z23 Encounter for immunization: Secondary | ICD-10-CM

## 2019-04-09 NOTE — Progress Notes (Signed)
   Covid-19 Vaccination Clinic  Name:  [name]    MRN: [id number] DOB: [birth year]  04/09/2019  [name] was observed post Covid-19 immunization for 15 minutes without incident. He was provided with Vaccine Information Sheet and instruction to access the V-Safe system.   [name] was instructed to call 911 with any severe reactions post vaccine: Marland Kitchen Difficulty breathing  . Swelling of face and throat  . A fast heartbeat  . A bad rash all over body  . Dizziness and weakness   Immunizations Administered    Name Date Dose VIS Date Route   Moderna COVID-19 Vaccine 04/09/2019 12:25 PM 0.5 mL 12/02/2018 Intramuscular   Manufacturer: Moderna   Lot: [id number]   NDC: 28003-491-79

## 2019-04-29 ENCOUNTER — Other Ambulatory Visit: Payer: Self-pay | Admitting: Cardiovascular Disease

## 2019-05-12 ENCOUNTER — Ambulatory Visit: Payer: Medicare Other | Attending: Internal Medicine

## 2019-05-12 DIAGNOSIS — Z23 Encounter for immunization: Secondary | ICD-10-CM

## 2019-05-12 NOTE — Progress Notes (Signed)
   Covid-19 Vaccination Clinic  Name:  [name]    MRN: [id number] DOB: 1965/05/06  05/12/2019  [name] was observed post Covid-19 immunization for 15 minutes without incident. He was provided with Vaccine Information Sheet and instruction to access the V-Safe system.   [name] was instructed to call 911 with any severe reactions post vaccine: Marland Kitchen Difficulty breathing  . Swelling of face and throat  . A fast heartbeat  . A bad rash all over body  . Dizziness and weakness   Immunizations Administered    Name Date Dose VIS Date Route   Moderna COVID-19 Vaccine 05/12/2019 12:21 PM 0.5 mL 12/2018 Intramuscular   Manufacturer: Moderna   Lot: [id number]   NDC: 71245-809-98

## 2019-05-13 ENCOUNTER — Ambulatory Visit: Payer: Medicare Other

## 2019-08-03 ENCOUNTER — Other Ambulatory Visit: Payer: Self-pay | Admitting: *Deleted

## 2019-08-03 ENCOUNTER — Other Ambulatory Visit: Payer: Self-pay | Admitting: Cardiology

## 2019-08-03 MED ORDER — RAMIPRIL 10 MG PO CAPS: 10.0000 mg | ORAL_CAPSULE | Freq: Two times a day (BID) | ORAL | 0 refills | Status: DC

## 2019-08-03 MED ORDER — METOPROLOL SUCCINATE ER 25 MG PO TB24: 25.0000 mg | ORAL_TABLET | Freq: Every day | ORAL | 0 refills | Status: DC

## 2019-08-03 MED ORDER — ATORVASTATIN CALCIUM 80 MG PO TABS: 80.0000 mg | ORAL_TABLET | Freq: Every day | ORAL | 0 refills | Status: DC

## 2019-08-03 MED ORDER — ISOSORBIDE MONONITRATE ER 60 MG PO TB24: 60.0000 mg | ORAL_TABLET | Freq: Every day | ORAL | 0 refills | Status: DC

## 2019-08-03 MED ORDER — AMLODIPINE BESYLATE 5 MG PO TABS: 5.0000 mg | ORAL_TABLET | Freq: Every day | ORAL | 0 refills | Status: DC

## 2019-08-19 IMAGING — CT CT ABD-PELV W/ CM
2 of 4 series · 16 of 46 positions shown, 18 images · IV contrast (Isovue)
Comparison: None.

CLINICAL DATA: Abdominal pain and vomiting with diarrhea x1 week.

EXAM:
CT ABDOMEN AND PELVIS WITH CONTRAST
TECHNIQUE: Multidetector CT imaging of the abdomen and pelvis was performed
using the standard protocol following bolus administration of
intravenous contrast.
CONTRAST:  100mL [id number] IOPAMIDOL ([id number]) INJECTION 61%

[Series 2: axial st · axial · 0.88mm/px · z∈[+1013,+1438]mm · 13 of 95 slices shown, 15 images]
[im 5/95  soft-tissue]
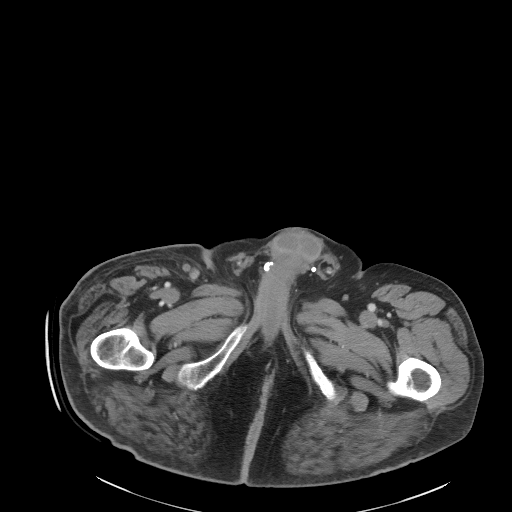
[im 5/95  bone]
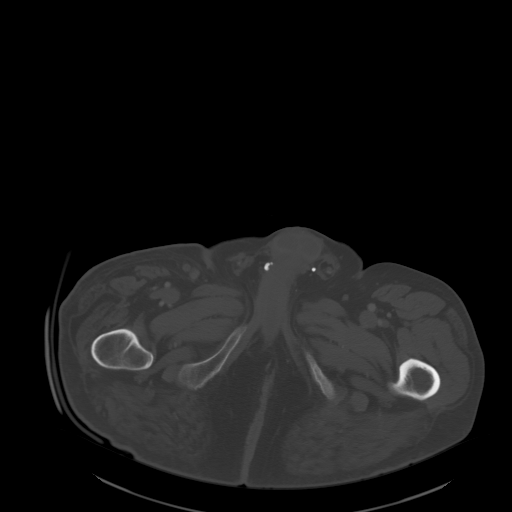
[im 13/95  soft-tissue]
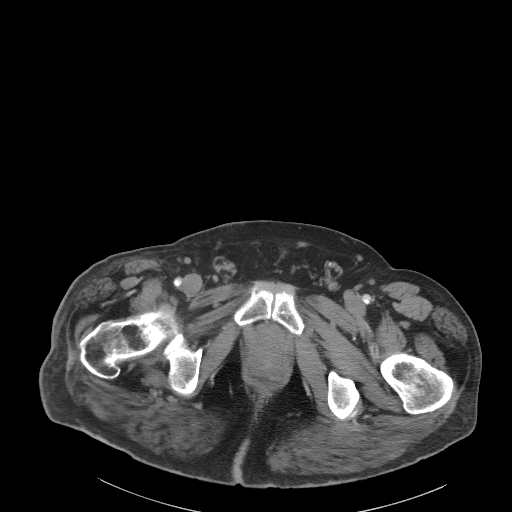
[im 21/95  soft-tissue]
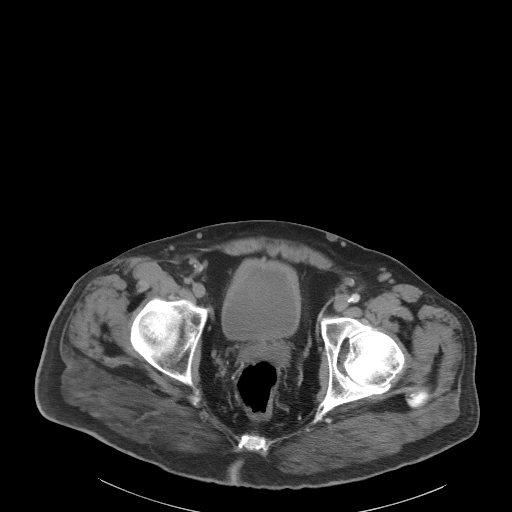
[im 25/95  soft-tissue]
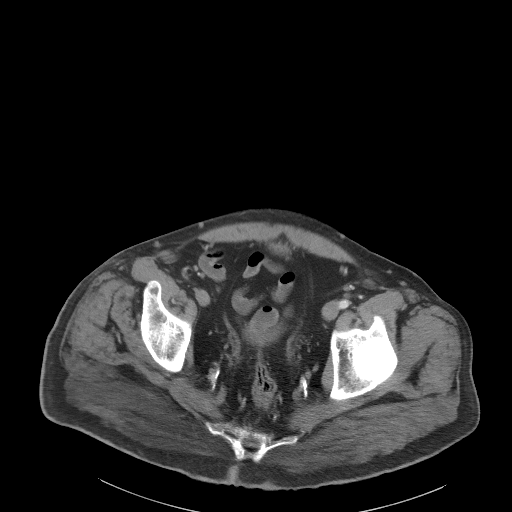
[im 33/95  soft-tissue]
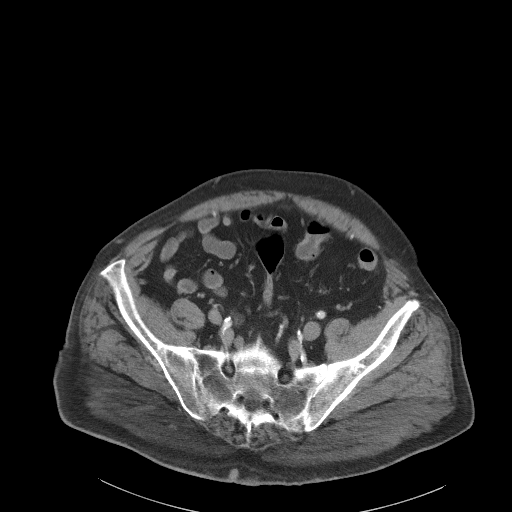
[im 41/95  soft-tissue]
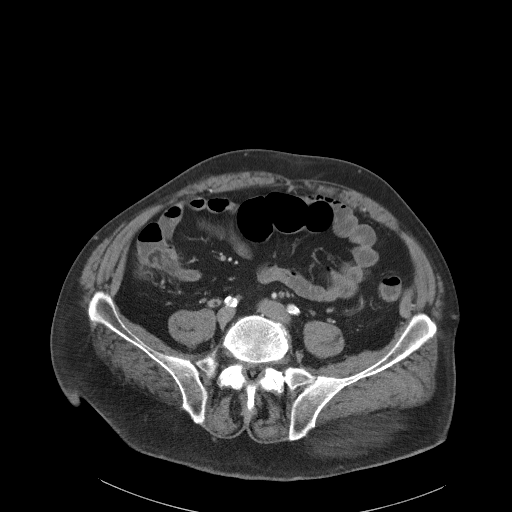
[im 50/95  soft-tissue]
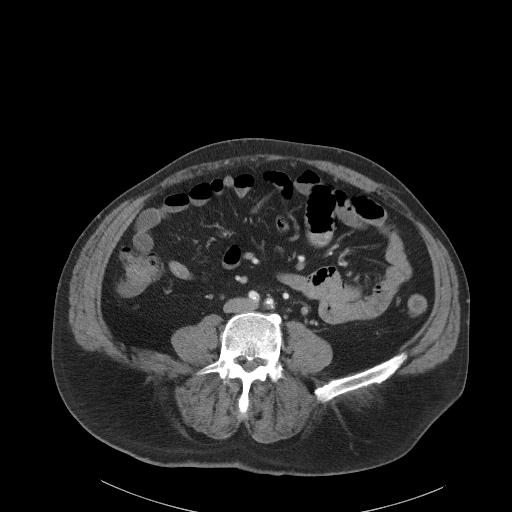
[im 54/95  soft-tissue]
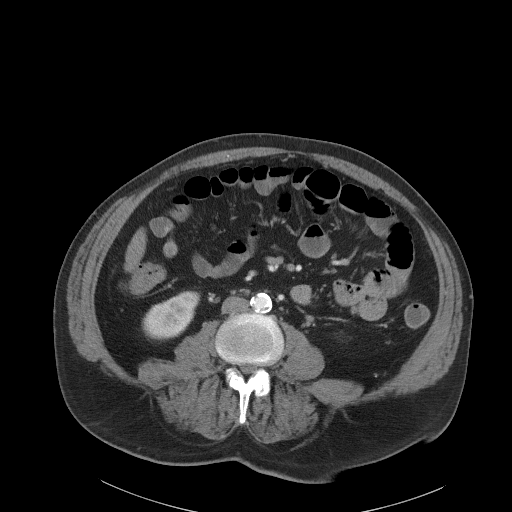
[im 62/95  soft-tissue]
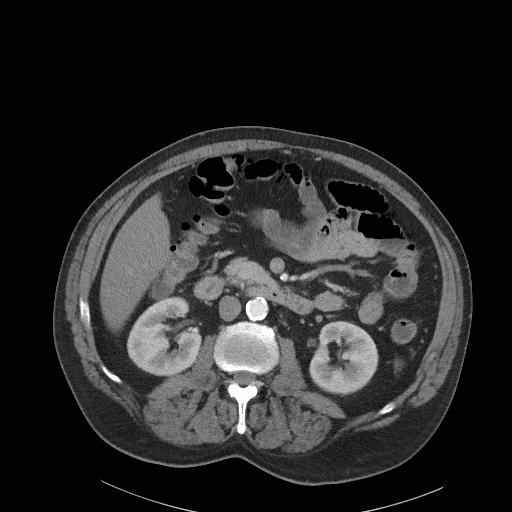
[im 62/95  bone]
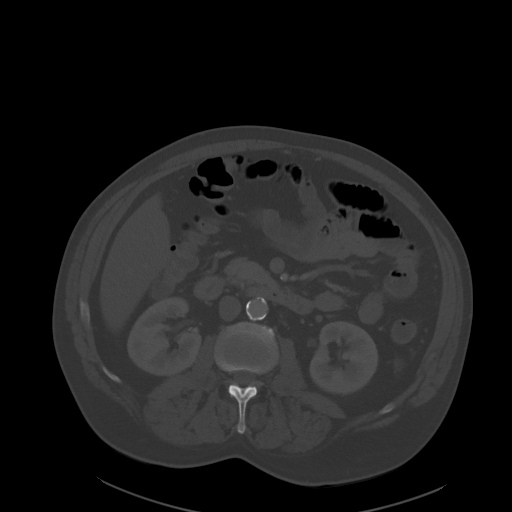
[im 70/95  soft-tissue]
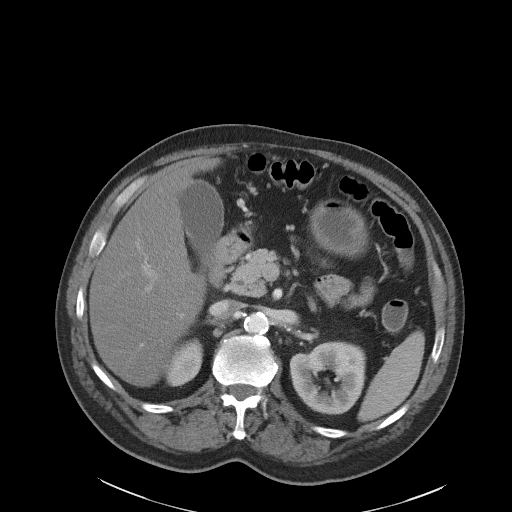
[im 74/95  soft-tissue]
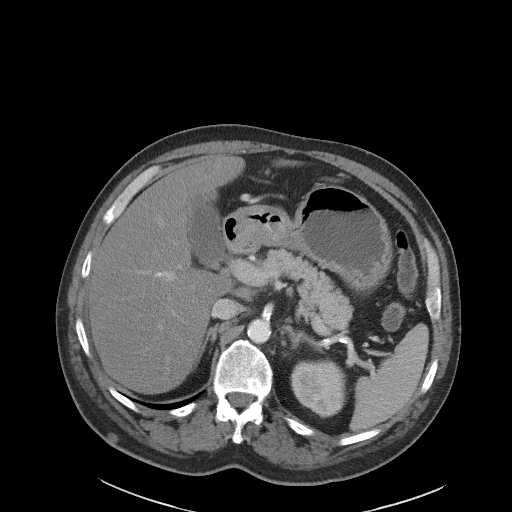
[im 82/95  soft-tissue]
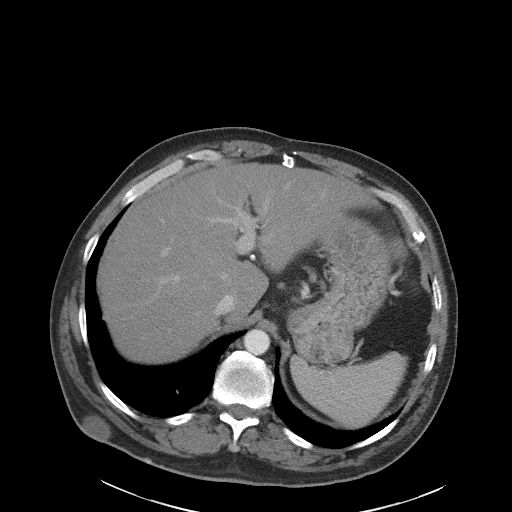
[im 90/95  soft-tissue]
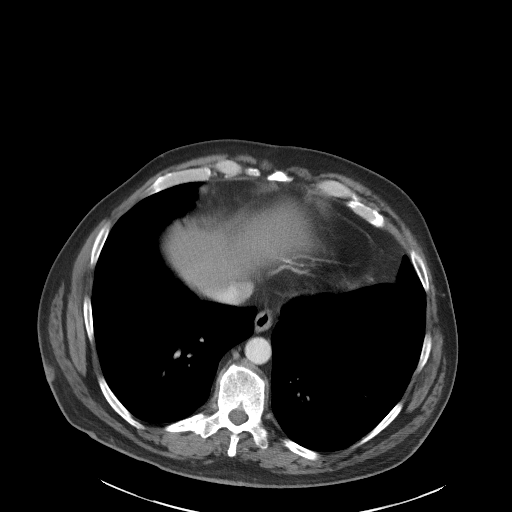

[Series 5: coronal st · coronal · 0.83mm/px · 3 of 109 slices shown]
[im 37/109  soft-tissue]
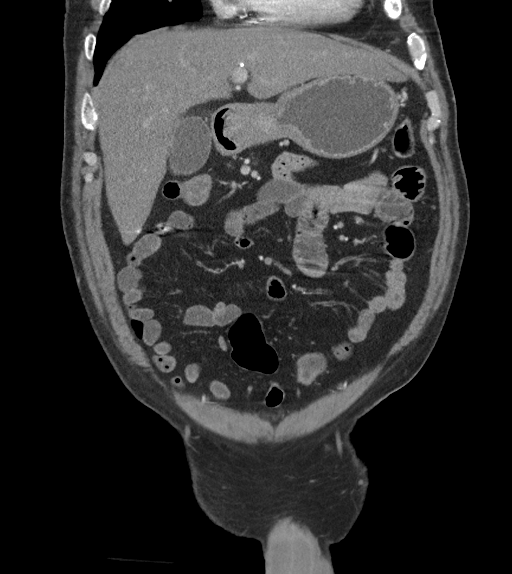
[im 49/109  soft-tissue]
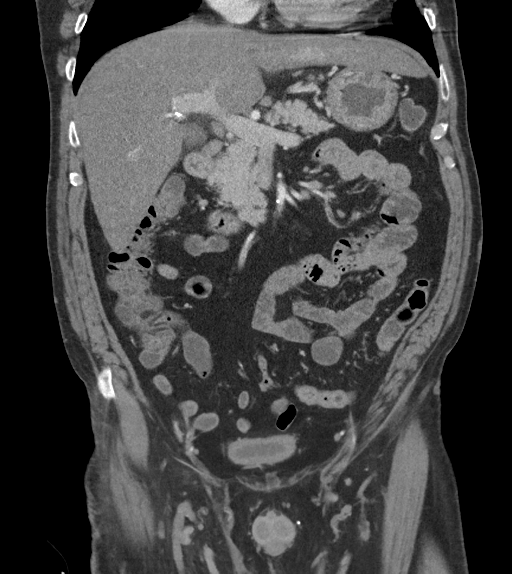
[im 61/109  soft-tissue]
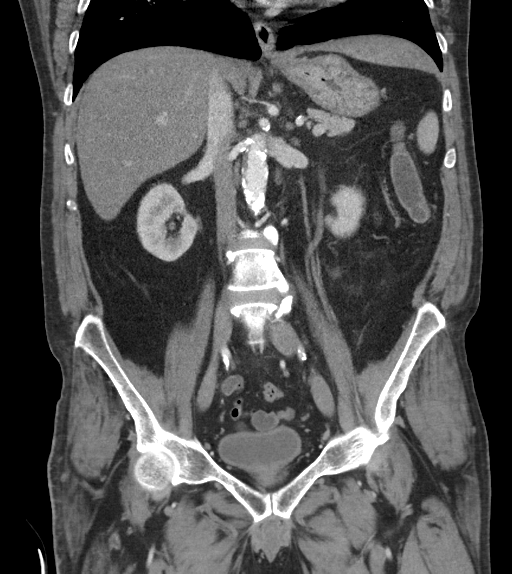

[16 of 46 positions shown; findings below may reference images not displayed]

FINDINGS: Lower chest: Normal heart size without pericardial effusion. Clear
lung bases.

Hepatobiliary: Hepatic steatosis. Physiologically distended
gallbladder without mural thickening or pericholecystic fluid. No
space-occupying mass of the liver. No biliary dilatation is
visualized.

Pancreas: Normal

Spleen: Normal

Adrenals/Urinary Tract: Normal bilateral adrenal glands. Too small
to characterize hypodensity in the upper pole of the right kidney
measuring 6 mm, series 5 back/73 likely reflects a small cyst but is
too small to characterize. No obstructive uropathy or definite solid
enhancing lesions. The urinary bladder is unremarkable for the
degree of distention.

Stomach/Bowel: The stomach is physiologically distended in
appearance. No mural or gastric fold thickening. The duodenal sweep
and ligament of Treitz are normal in position. Mild fluid-filled
distention of distal jejunum and ileum may reflect small bowel
enteritis. The distal and terminal ileum are unremarkable. The
appendix is not confidently identified but no pericecal inflammation
is seen. No large bowel obstruction or inflammation.

Vascular/Lymphatic: Moderate aortoiliac atherosclerosis. 1 cm short
axis peripancreatic lymph node. No lymphadenopathy by CT size
criteria.

Reproductive: Normal size prostate and seminal vesicles.

Other: Subcutaneous soft tissue nodules along the right flank are
noted likely representing small sebaceous cysts, the largest
approximately 3.3 cm.

Musculoskeletal: No acute nor suspicious osseous abnormality.
Degenerative changes are present along the dorsal spine.
IMPRESSION: 1. Mild fluid-filled distention of jejunum and distal ileum
compatible small bowel enteritis. No mechanical bowel obstruction.
2. Hepatic steatosis.
3. Subcentimeter hypodensity in the upper pole the right kidney
statistically consistent with a cyst but too small to further
characterize.
4. Moderate aortoiliac branch vessel atherosclerosis.
5. Subcutaneous nodules in the right flank that may represent small
sebaceous cysts.

## 2019-08-26 ENCOUNTER — Ambulatory Visit: Payer: Medicare Other | Admitting: Cardiology

## 2019-10-06 ENCOUNTER — Encounter: Payer: Self-pay | Admitting: Cardiology
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
## 2019-10-07 ENCOUNTER — Ambulatory Visit (INDEPENDENT_AMBULATORY_CARE_PROVIDER_SITE_OTHER): Payer: Medicare Other | Admitting: Cardiology

## 2019-10-07 ENCOUNTER — Encounter: Payer: Self-pay | Admitting: Cardiology

## 2019-10-07 VITALS — BP 122/82 | HR 75 | Ht 72.0 in | Wt 186.0 lb

## 2019-10-07 DIAGNOSIS — I25119 Atherosclerotic heart disease of native coronary artery with unspecified angina pectoris: Secondary | ICD-10-CM

## 2019-10-07 DIAGNOSIS — R011 Cardiac murmur, unspecified: Secondary | ICD-10-CM

## 2019-10-07 DIAGNOSIS — E782 Mixed hyperlipidemia: Secondary | ICD-10-CM | POA: Diagnosis not present

## 2019-10-07 MED ORDER — NITROGLYCERIN 0.4 MG SL SUBL: SUBLINGUAL_TABLET | SUBLINGUAL | 3 refills | Status: DC

## 2019-10-07 NOTE — Patient Instructions (Signed)
Your physician wants you to follow-up in: 1 YEAR WITH DR MCDOWELL You will receive a reminder letter in the mail two months in advance. If you don't receive a letter, please call our office to schedule the follow-up appointment.  Your physician recommends that you continue on your current medications as directed. Please refer to the Current Medication list given to you today.  Your physician has requested that you have an echocardiogram. Echocardiography is a painless test that uses sound waves to create images of your heart. It provides your doctor with information about the size and shape of your heart and how well your heart's chambers and valves are working. This procedure takes approximately one hour. There are no restrictions for this procedure.  Thank you for choosing [facility]!!    

## 2019-10-13 ENCOUNTER — Ambulatory Visit (INDEPENDENT_AMBULATORY_CARE_PROVIDER_SITE_OTHER): Payer: Medicare Other

## 2019-10-13 DIAGNOSIS — R011 Cardiac murmur, unspecified: Secondary | ICD-10-CM | POA: Diagnosis not present

## 2019-10-13 LAB — ECHOCARDIOGRAM COMPLETE
Area-P 1/2: 3.3 cm2
Calc EF: 69.8 %
S' Lateral: 3.47 cm
Single Plane A2C EF: 75 %
Single Plane A4C EF: 66.2 %

## 2019-10-26 ENCOUNTER — Telehealth: Payer: Self-pay | Admitting: *Deleted

## 2019-10-26 NOTE — Telephone Encounter (Signed)
Pt aware.

## 2019-10-26 NOTE — Telephone Encounter (Signed)
-----   Message from Jonelle Sidle, MD sent at 10/14/2019  7:58 AM EDT ----- Results reviewed. LVEF is vigorous at 65 to 70%. No significant valvular abnormalities. Continue medical therapy and current follow-up plan.

## 2019-11-02 ENCOUNTER — Other Ambulatory Visit: Payer: Self-pay | Admitting: *Deleted

## 2019-11-02 MED ORDER — AMLODIPINE BESYLATE 5 MG PO TABS
5.0000 mg | ORAL_TABLET | Freq: Every day | ORAL | 3 refills | Status: DC
Start: 2019-11-02 — End: 2020-07-26

## 2019-11-02 MED ORDER — RAMIPRIL 10 MG PO CAPS
10.0000 mg | ORAL_CAPSULE | Freq: Two times a day (BID) | ORAL | 3 refills | Status: DC
Start: 2019-11-02 — End: 2020-07-25

## 2019-11-04 ENCOUNTER — Telehealth: Payer: Self-pay | Admitting: Cardiology

## 2019-11-04 NOTE — Telephone Encounter (Signed)
   [facility] Pre-operative Risk Assessment    HEARTCARE STAFF: - Please ensure there is not already an duplicate clearance open for this procedure. - Under Visit Info/Reason for Call, type in Other and utilize the format Clearance MM/DD/YY or Clearance TBD. Do not use dashes or single digits. - If request is for dental extraction, please clarify the # of teeth to be extracted.  Request for surgical clearance:  1. What type of surgery is being performed? 1. Doesn't say on faxed clearance.   2. When is this surgery scheduled?  1. 11/16/2019  3. What type of clearance is required (medical clearance vs. Pharmacy clearance to hold med vs. Both)?  1. Pharmacy  4. Are there any medications that need to be held prior to surgery and how long? 1. ASA 5-7 days prior to procedure  5. Practice name and name of physician performing surgery? 1. [facility] -[place] Dr. Thurmond Butts Terlecki  6. What is the office phone number?  1. [phone]   7.   What is the office fax number?   1.   [phone]  8.   Anesthesia type (None, local, MAC, general) ?   1.   Did not say.   Desma Paganini 11/04/2019, 2:30 PM  _________________________________________________________________   (provider comments below)

## 2019-11-04 NOTE — Telephone Encounter (Signed)
Left voice message for someone to call the callback pool back due to information is missing and needed.

## 2019-11-04 NOTE — Telephone Encounter (Signed)
Need to know the type of surgery for clearance. Also need to verify with meds to hold. Doesn't make sense to hold aspirin when the patient is on both aspirin and plavix. Ideally need to know the anesthesia type as well.

## 2019-11-05 NOTE — Telephone Encounter (Signed)
Left message with [facility] to obtain more detail.   Under Careeverywhere it appears the surgery will be a scrotal exploration for a possible scrotal cyst/lesion. Still not sure the type of anesthesia.

## 2019-11-06 NOTE — Telephone Encounter (Signed)
Try calling [facility] to get surgical information on pt, leave message for office to give [place] a call back.

## 2019-11-06 NOTE — Telephone Encounter (Signed)
Hi Dr. Diona Browner,  [name] is scheduled to have scrotal lesions excised on 11/16/2019 and is being asked to hold antiplatelet therapy. She has a history of CAD s/p CABG with patent grafts on last cath in 2012. He did undergo DES intervention to OM2 (not bypassed) at that time. He also has history of PAD with total occlusion in the right femoral artery system and mild bilateral carotid artery disease. He was recently seen by you on 10/07/2019 at which time he noted occasional brief episodes of chest pain last only a few seconds at a time. Otherwise, doing well. Murmur was noted on exam so Echo was ordered and showed LVEF of 65-70% with no significant valvular disease. Can you please comment on how long Aspirin and Plavix can be held for upcoming procedure?  Please route response back to P CV DIV PREOP.  Thank you! Kelilah Hebard

## 2019-11-06 NOTE — Telephone Encounter (Signed)
    [facility] called back, she gave surgical information. She said pt will have excision of scrotal lesions

## 2019-11-07 NOTE — Telephone Encounter (Signed)
Should be able to hold aspirin 7 days and Plavix 5 days prior to procedure.

## 2019-11-09 ENCOUNTER — Telehealth: Payer: Self-pay | Admitting: Cardiology

## 2019-11-09 NOTE — Telephone Encounter (Signed)
ERROR

## 2019-11-09 NOTE — Telephone Encounter (Signed)
Leave message for pt to call back 

## 2019-11-09 NOTE — Telephone Encounter (Signed)
   Primary Cardiologist: Nona Dell, MD  Chart reviewed as part of pre-operative protocol coverage.   Per Dr. Diona Browner, patient can hold aspirin 7 days prior to his procedure and plavix 5 days prior to his procedure with plans to restart as soon as he is cleared to do so by his surgeon.  I will route this recommendation to the requesting party via Epic fax function and remove from pre-op pool.  Please call with questions.  Callback: - Please update patient to these recommendations as he will need to begin holding aspirin today and plavix starting 11/11/19. Thank you!  Beatriz Stallion, PA-C 11/09/2019, 8:40 AM

## 2019-11-10 ENCOUNTER — Telehealth: Payer: Self-pay | Admitting: Cardiology

## 2019-11-10 NOTE — Telephone Encounter (Signed)
2nd attempt to reach pt to go over medication protocols. No answer. Lvm

## 2019-11-10 NOTE — Telephone Encounter (Signed)
New Message:     Pt said he received a message to call this office, he did not know who called. I did not or could not see who called the patient.

## 2019-11-10 NOTE — Telephone Encounter (Signed)
   Appears patient was returning a call to preop callback who called last week to relay recommendations for medications prior to his procedure. I left a voicemail with instructions to hold aspirin 7 days and plavix 5 days prior to his procedure. He was instructed to call back if any questions.   Beatriz Stallion, PA-C 11/10/19; 4:58 PM

## 2020-01-25 ENCOUNTER — Other Ambulatory Visit: Payer: Self-pay | Admitting: Family Medicine

## 2020-02-01 ENCOUNTER — Other Ambulatory Visit: Payer: Self-pay | Admitting: Family Medicine

## 2020-05-02 ENCOUNTER — Other Ambulatory Visit: Payer: Self-pay | Admitting: Family Medicine

## 2020-07-25 ENCOUNTER — Other Ambulatory Visit: Payer: Self-pay | Admitting: Cardiology

## 2020-07-26 ENCOUNTER — Other Ambulatory Visit: Payer: Self-pay | Admitting: Cardiology
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
## 2020-10-18 ENCOUNTER — Ambulatory Visit: Payer: Medicare Other | Admitting: Family Medicine

## 2020-10-18 DIAGNOSIS — E782 Mixed hyperlipidemia: Secondary | ICD-10-CM

## 2020-10-18 DIAGNOSIS — R011 Cardiac murmur, unspecified: Secondary | ICD-10-CM

## 2020-10-18 DIAGNOSIS — R0989 Other specified symptoms and signs involving the circulatory and respiratory systems: Secondary | ICD-10-CM

## 2020-10-18 DIAGNOSIS — I2510 Atherosclerotic heart disease of native coronary artery without angina pectoris: Secondary | ICD-10-CM

## 2020-10-25 ENCOUNTER — Other Ambulatory Visit: Payer: Self-pay | Admitting: Family Medicine
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
## 2020-10-27 ENCOUNTER — Other Ambulatory Visit: Payer: Self-pay

## 2020-10-27 ENCOUNTER — Encounter: Payer: Self-pay | Admitting: Family Medicine

## 2020-10-27 ENCOUNTER — Ambulatory Visit (INDEPENDENT_AMBULATORY_CARE_PROVIDER_SITE_OTHER): Payer: Medicare Other | Admitting: Family Medicine

## 2020-10-27 VITALS — BP 138/82 | HR 77 | Ht 72.0 in | Wt 191.0 lb

## 2020-10-27 DIAGNOSIS — I2510 Atherosclerotic heart disease of native coronary artery without angina pectoris: Secondary | ICD-10-CM | POA: Diagnosis not present

## 2020-10-27 DIAGNOSIS — R0989 Other specified symptoms and signs involving the circulatory and respiratory systems: Secondary | ICD-10-CM | POA: Diagnosis not present

## 2020-10-27 DIAGNOSIS — Z720 Tobacco use: Secondary | ICD-10-CM

## 2020-10-27 DIAGNOSIS — E782 Mixed hyperlipidemia: Secondary | ICD-10-CM | POA: Diagnosis not present

## 2020-10-27 DIAGNOSIS — I10 Essential (primary) hypertension: Secondary | ICD-10-CM

## 2020-10-27 NOTE — Patient Instructions (Addendum)
Medication Instructions:  Continue all current medications.  Labwork: none  Testing/Procedures:  Your physician has requested that you have a carotid duplex. This test is an ultrasound of the carotid arteries in your neck. It looks at blood flow through these arteries that supply the brain with blood. Allow one hour for this exam. There are no restrictions or special instructions.  Office will contact with results via phone or letter.    Follow-Up: Your physician wants you to follow up in:  1 year.  You will receive a reminder letter in the mail one-two months in advance.  If you don't receive a letter, please call our office to schedule the follow up appointment   Any Other Special Instructions Will Be Listed Below (If Applicable).  If you need a refill on your cardiac medications before your next appointment, please call your pharmacy.  

## 2020-11-02 ENCOUNTER — Other Ambulatory Visit: Payer: Self-pay

## 2020-11-02 ENCOUNTER — Ambulatory Visit (HOSPITAL_COMMUNITY)
Admission: RE | Admit: 2020-11-02 | Discharge: 2020-11-02 | Disposition: A | Discharge status: Home / Self Care | Payer: Medicare Other | Source: Ambulatory Visit | Attending: Family Medicine | Admitting: Family Medicine

## 2020-11-02 DIAGNOSIS — R0989 Other specified symptoms and signs involving the circulatory and respiratory systems: Secondary | ICD-10-CM | POA: Insufficient documentation

## 2020-11-04 ENCOUNTER — Telehealth: Payer: Self-pay | Admitting: *Deleted

## 2020-11-04 NOTE — Telephone Encounter (Signed)
-----   Message from Netta Neat., NP sent at 11/02/2020  6:18 PM EDT ----- Please call the patient and let him know the carotid study did no show any significant narrowing in either carotid artery. Just some mild plaque present.   Netta Neat, NP  11/02/2020 6:15 PM

## 2020-11-04 NOTE — Telephone Encounter (Signed)
Lesle Chris, LPN  08/08/1101  6:21 PM EDT Back to Top    Notified, copy to pcp.

## 2020-11-23 ENCOUNTER — Other Ambulatory Visit: Payer: Self-pay | Admitting: Family Medicine

## 2020-12-19 ENCOUNTER — Other Ambulatory Visit: Payer: Self-pay | Admitting: Cardiology

## 2021-04-25 ENCOUNTER — Other Ambulatory Visit: Payer: Self-pay | Admitting: *Deleted

## 2021-04-25 MED ORDER — ISOSORBIDE MONONITRATE ER 60 MG PO TB24: 60.0000 mg | ORAL_TABLET | Freq: Every day | ORAL | 1 refills | Status: DC

## 2021-04-25 MED ORDER — ATORVASTATIN CALCIUM 80 MG PO TABS: 80.0000 mg | ORAL_TABLET | Freq: Every day | ORAL | 1 refills | Status: DC

## 2021-04-25 MED ORDER — METOPROLOL SUCCINATE ER 25 MG PO TB24: 25.0000 mg | ORAL_TABLET | Freq: Every day | ORAL | 1 refills | Status: DC

## 2021-04-26 ENCOUNTER — Telehealth: Payer: Self-pay | Admitting: *Deleted

## 2021-04-26 NOTE — Telephone Encounter (Signed)
? ?  Pre-operative Risk Assessment  ?  ?Patient Name: [name]  ?DOB: 09-11-1965 ?MRN: [id number]  ? ?  ? ?Request for Surgical Clearance   ? ?Procedure:   Penile and Scrotal lesion removal ? ?Date of Surgery:  Clearance TBD                              ?   ?Surgeon:  Dr. Lenoria Chime  ?Surgeon's Group or Practice Name:  @[facility] ?Phone number:  [phone] ?Fax number:  [phone] ?  ?Type of Clearance Requested:   ?- Pharmacy:  Hold Aspirin and Clopidogrel (Plavix) TEN days prior to procedure ?  ?Type of Anesthesia:  Not Indicated ?  ?Additional requests/questions:   ? ?Signed, ?Marlou Sa   ?04/26/2021, 4:33 PM  ?

## 2021-04-27 NOTE — Telephone Encounter (Signed)
Pharmacy, please see note from Dr. Diona Browner.  ?

## 2021-04-27 NOTE — Telephone Encounter (Signed)
Left message for the pt to call for appt in the office. Per Dr.McDowell and pre op provider ok to scheduled in [place] locations, [facility] [place] or [facility] for pre op clearance. Per [place] office nothing available in [place] until June and [facility] July.  ? ?I will send FYI to requesting office the pt will need an appt.  ?

## 2021-04-27 NOTE — Telephone Encounter (Signed)
. ? ?  Name: [name]  ?DOB: [birth year]  ?MRN: [id number] ? ?Primary Cardiologist: Rozann Lesches, MD ? ?Chart reviewed as part of pre-operative protocol coverage. Because of [name]'s past medical history and time since last visit, he will require a follow-up in-office visit in order to better assess preoperative cardiovascular risk. ? ?Pre-op covering staff: ?- Please schedule appointment and call patient to inform them. If patient already had an upcoming appointment within acceptable timeframe, please add "pre-op clearance" to the appointment notes so provider is aware. ?- Please contact requesting surgeon's office via preferred method (i.e, phone, fax) to inform them of need for appointment prior to surgery. ? ?This message will also be routed to pharmacy pool and/or Dr Domenic Polite for input on holding ASA/Plavix x 10 days as requested by [facility] as requested below so that this information is available to the clearing provider at time of patient's appointment with request that this information be routed back to the preop pool.  ? ?Christell Faith, PA-C  ?04/27/2021, 10:46 AM  ? ?

## 2021-04-27 NOTE — Telephone Encounter (Signed)
Will send a message to [place] office to see where they can get the pt scheduled for a pre op appt.  ?

## 2021-04-27 NOTE — Telephone Encounter (Signed)
Agree with Dr McDowell's recommendations of 5 day Plavix hold and 7 day aspirin hold.   ? ?Per urology notes: Exam: NAD, A/O x 3  ?Numerous scrotal sebaceous cysts, non tender and without fluctuance or signs of abscess. He is uncircumcised and the foreskin is retractable. He has multiple small cystic lesions on the proximal shaft skin. ?

## 2021-04-27 NOTE — Telephone Encounter (Signed)
RE: PRE OP APPT PLEASE ?Received: Today ?Slaughter, Vicky T  Onalee Steinbach, Erick Blinks, CMA; P Cv [employer] Scheduling ?First available in [place] is June 1st and Linna Hoff is July.  ?

## 2021-05-01 NOTE — Telephone Encounter (Signed)
Left message for the pt to call back for appt. In hopes of reaching the pt today I was going to offer 05/04/21 @ 8:20 with Dr. Domenic Polite. Left message to call back ASAP as this appt may not be available. See previous notes. I will send FYI to requesting office that the pt is going to need an appt and that we have been trying to reach the pt.  ?

## 2021-05-03 NOTE — Telephone Encounter (Signed)
Chart sent to preop to please advise. Per notes below, patient was deemed needing a f/u visit which has been scheduled for 05/23/21 with Tereso Newcomer. In EMR the surgery date is 06/21/21 so this should be fine. I am not sure what else needs to be advised upon but will route to callback to ensure patient knows to discuss surgical clearance at preop visit. ?

## 2021-05-03 NOTE — Telephone Encounter (Signed)
Pt returned call and was scheduled for appt on 05/23/21 with PA, Tereso Newcomer @ [address] for Pre-Op per notes. Please advise ?

## 2021-05-03 NOTE — Telephone Encounter (Signed)
Spoke with patient and made him aware that he will be evaluated for surgery at his next appointment. He voiced understanding.  ?

## 2021-05-23 ENCOUNTER — Encounter: Payer: Self-pay | Admitting: Physician Assistant

## 2021-05-23 ENCOUNTER — Ambulatory Visit (INDEPENDENT_AMBULATORY_CARE_PROVIDER_SITE_OTHER): Payer: Medicare HMO | Admitting: Physician Assistant

## 2021-05-23 VITALS — BP 102/60 | HR 69 | Ht 72.0 in | Wt 186.4 lb

## 2021-05-23 DIAGNOSIS — I25708 Atherosclerosis of coronary artery bypass graft(s), unspecified, with other forms of angina pectoris: Secondary | ICD-10-CM

## 2021-05-23 DIAGNOSIS — I10 Essential (primary) hypertension: Secondary | ICD-10-CM | POA: Diagnosis not present

## 2021-05-23 DIAGNOSIS — E785 Hyperlipidemia, unspecified: Secondary | ICD-10-CM

## 2021-05-23 DIAGNOSIS — Z01810 Encounter for preprocedural cardiovascular examination: Secondary | ICD-10-CM | POA: Diagnosis not present

## 2021-05-23 DIAGNOSIS — Z89611 Acquired absence of right leg above knee: Secondary | ICD-10-CM

## 2021-05-23 DIAGNOSIS — R0989 Other specified symptoms and signs involving the circulatory and respiratory systems: Secondary | ICD-10-CM

## 2021-05-23 NOTE — Assessment & Plan Note (Addendum)
Mr. Balkcom perioperative risk of a major cardiac event is 0.9% according to the Revised Cardiac Risk Index (RCRI).  Therefore, he is at low risk for perioperative complications.   His functional capacity is good at 4.31 METs according to the Duke Activity Status Index (DASI). Recommendations: According to ACC/AHA guidelines, no further cardiovascular testing needed.  The patient may proceed to surgery at acceptable risk.   Antiplatelet and/or Anticoagulation Recommendations: Aspirin can be held for 7 days prior to his surgery.  Please resume Aspirin post operatively when it is felt to be safe from a bleeding standpoint.  Clopidogrel (Plavix) can be held for 5 days prior to his surgery and resumed as soon as possible post op.

## 2021-05-23 NOTE — Assessment & Plan Note (Signed)
The patient's blood pressure is controlled on his current regimen.  Continue current therapy.  

## 2021-05-23 NOTE — Assessment & Plan Note (Signed)
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  His bypass grafts were patent at the time of his catheterization in 2012.  He is doing well without anginal symptoms.  Continue amlodipine 5 mg daily, aspirin 81 mg daily, Lipitor 80 mg daily, Plavix 75 mg daily, isosorbide mononitrate 60 mg daily, Toprol-XL 25 mg daily.  Follow-up with Dr. Diona Browner as planned in October.

## 2021-05-23 NOTE — Assessment & Plan Note (Signed)
Continue Lipitor 80 mg daily.

## 2021-05-23 NOTE — Patient Instructions (Signed)
Medication Instructions:  Your physician recommends that you continue on your current medications as directed. Please refer to the Current Medication list given to you today.  *If you need a refill on your cardiac medications before your next appointment, please call your pharmacy*   Lab Work: None ordered  If you have labs (blood work) drawn today and your tests are completely normal, you will receive your results only by: MyChart Message (if you have MyChart) OR A paper copy in the mail If you have any lab test that is abnormal or we need to change your treatment, we will call you to review the results.   Testing/Procedures: None ordered   Follow-Up: At [facility], you and your health needs are our priority.  As part of our continuing mission to provide you with exceptional heart care, we have created designated Provider Care Teams.  These Care Teams include your primary Cardiologist (physician) and Advanced Practice Providers (APPs -  Physician Assistants and Nurse Practitioners) who all work together to provide you with the care you need, when you need it.  We recommend signing up for the patient portal called "MyChart".  Sign up information is provided on this After Visit Summary.  MyChart is used to connect with patients for Virtual Visits (Telemedicine).  Patients are able to view lab/test results, encounter notes, upcoming appointments, etc.  Non-urgent messages can be sent to your provider as well.   To learn more about what you can do with MyChart, go to [email or web address].    Your next appointment:   10/2021  The format for your next appointment:   In Person  Provider:   Nona Dell, MD    Other Instructions You may hold your Clopidogrel 5 days before surgery and Aspirin 7 days before surgery. Please restart as soon as the surgeon lets you know it's ok to restart  Important Information About Sugar

## 2021-05-23 NOTE — Assessment & Plan Note (Signed)
He ambulates fairly well with a prosthetic limb.

## 2021-05-23 NOTE — Assessment & Plan Note (Signed)
Stable disease by ultrasound in November 2022.
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
## 2021-07-25 ENCOUNTER — Other Ambulatory Visit: Payer: Self-pay | Admitting: Cardiology

## 2021-10-26 ENCOUNTER — Other Ambulatory Visit: Payer: Self-pay | Admitting: Cardiology

## 2021-10-27 ENCOUNTER — Encounter: Payer: Self-pay | Admitting: Cardiology

## 2021-10-27 ENCOUNTER — Ambulatory Visit: Payer: Medicare HMO | Attending: Cardiology | Admitting: Cardiology

## 2021-10-27 VITALS — BP 114/78 | HR 61 | Ht 72.0 in | Wt 204.2 lb

## 2021-10-27 DIAGNOSIS — E782 Mixed hyperlipidemia: Secondary | ICD-10-CM | POA: Diagnosis not present

## 2021-10-27 DIAGNOSIS — I25119 Atherosclerotic heart disease of native coronary artery with unspecified angina pectoris: Secondary | ICD-10-CM

## 2021-10-27 MED ORDER — NITROGLYCERIN 0.4 MG SL SUBL: 0.4000 mg | SUBLINGUAL_TABLET | SUBLINGUAL | 3 refills | Status: DC | PRN

## 2021-10-27 NOTE — Patient Instructions (Signed)
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
## 2022-02-01 ENCOUNTER — Other Ambulatory Visit: Payer: Self-pay | Admitting: Cardiology

## 2022-05-01 ENCOUNTER — Other Ambulatory Visit: Payer: Self-pay | Admitting: Cardiology

## 2022-05-17 ENCOUNTER — Encounter: Payer: Self-pay | Admitting: Cardiology

## 2022-05-17 ENCOUNTER — Ambulatory Visit: Payer: Medicare HMO | Attending: Cardiology | Admitting: Cardiology

## 2022-05-17 VITALS — BP 101/66 | HR 60 | Ht 72.0 in | Wt 200.4 lb

## 2022-05-17 DIAGNOSIS — E782 Mixed hyperlipidemia: Secondary | ICD-10-CM

## 2022-05-17 DIAGNOSIS — R0989 Other specified symptoms and signs involving the circulatory and respiratory systems: Secondary | ICD-10-CM | POA: Diagnosis not present

## 2022-05-17 DIAGNOSIS — I10 Essential (primary) hypertension: Secondary | ICD-10-CM | POA: Diagnosis not present

## 2022-05-17 DIAGNOSIS — I25119 Atherosclerotic heart disease of native coronary artery with unspecified angina pectoris: Secondary | ICD-10-CM
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
## 2022-05-17 NOTE — Patient Instructions (Addendum)
Medication Instructions:  ?Your physician recommends that you continue on your current medications as directed. Please refer to the Current Medication list given to you today. ? ?Labwork: ?none ? ?Testing/Procedures: ?Your physician has requested that you have a carotid duplex. This test is an ultrasound of the carotid arteries in your neck. It looks at blood flow through these arteries that supply the brain with blood. Allow one hour for this exam. There are no restrictions or special instructions.  ? ?Follow-Up: ?Your physician recommends that you schedule a follow-up appointment in: 6 months ? ?Any Other Special Instructions Will Be Listed Below (If Applicable). ? ?If you need a refill on your cardiac medications before your next appointment, please call your pharmacy. ? ?

## 2022-06-01 ENCOUNTER — Ambulatory Visit (HOSPITAL_COMMUNITY)
Admission: RE | Admit: 2022-06-01 | Discharge: 2022-06-01 | Disposition: A | Discharge status: Home / Self Care | Payer: Medicare HMO | Source: Ambulatory Visit | Attending: Cardiology | Admitting: Cardiology

## 2022-06-01 DIAGNOSIS — R0989 Other specified symptoms and signs involving the circulatory and respiratory systems: Secondary | ICD-10-CM | POA: Insufficient documentation

## 2022-06-12 ENCOUNTER — Telehealth: Payer: Self-pay | Admitting: Cardiology

## 2022-06-12 NOTE — Telephone Encounter (Signed)
Patient was returning call for results. Please advise °

## 2022-06-13 NOTE — Telephone Encounter (Signed)
-----   Message from Ellsworth Lennox, [place] sent at 06/01/2022  4:10 PM EDT ----- Covering for Dr. Diona Browner - Please let the patient know his carotid doppler study showed a moderate to large amount of plaque along his left internal carotid artery and minimal to moderate plaque amount along his right internal carotid artery but was not causing significant blockage and was overall similar to prior imaging in 11/2020. He is already on ASA, Plavix and statin therapy. Would anticipate follow-up imaging in 1 year with either a follow-up carotid doppler study or CTA neck.

## 2022-06-18 NOTE — Telephone Encounter (Signed)
Patient informed. Copy sent to PCP °

## 2022-08-13 ENCOUNTER — Telehealth: Payer: Self-pay | Admitting: Cardiology

## 2022-08-13 NOTE — Telephone Encounter (Signed)
   Pre-operative Risk Assessment    Patient Name: [name]  DOB: Sep 23, 1965 MRN: [id number]      Request for Surgical Clearance    Procedure:   Colonoscopy  Date of Surgery:  Clearance 08/22/22                                 Surgeon:  Dr. Gwynne Edinger Surgeon's Group or Practice Name:  [facility]  Phone number:  [phone]  Fax number:  [phone]   Type of Clearance Requested:   - Pharmacy:  Hold Clopidogrel (Plavix)     Type of Anesthesia:  MAC   Additional requests/questions:   Caller stated patient needs to hold his Plavix and has a history of polyps.  Signed, Annetta Maw   08/13/2022, 2:38 PM

## 2022-08-13 NOTE — Telephone Encounter (Signed)
   Name: [name]  DOB: 1965-06-07  MRN: [id number]  Primary Cardiologist: Nona Dell, MD  Chart reviewed as part of pre-operative protocol coverage. Because of [name]'s past medical history and time since last visit, he will require a follow-up telephone visit in order to better assess preoperative cardiovascular risk.  Pre-op covering staff: - Please schedule appointment and call patient to inform them. If patient already had an upcoming appointment within acceptable timeframe, please add "pre-op clearance" to the appointment notes so provider is aware. - Please contact requesting surgeon's office via preferred method (i.e, phone, fax) to inform them of need for appointment prior to surgery.  I will send a message to Dr. Diona Browner regarding plavix hold with "moderate to large amount of plaque along the right internal carotid artery".  Sharlene Dory, PA-C  08/13/2022, 3:17 PM

## 2022-08-14 NOTE — Telephone Encounter (Signed)
Left message to call back to schedule a tele pre op appt. Ok per Jari Favre, [facility] to add pt on this week in over book slot (provider use) due to med hold and procedure date.

## 2022-08-15 ENCOUNTER — Telehealth: Payer: Self-pay

## 2022-08-15 NOTE — Telephone Encounter (Signed)
2nd attempt to reach pt regarding surgical clearance and the need for an TELEVISIT appointment.  Left pt a detailed message to call back and get that scheduled. Ok per Jari Favre, [facility] to add pt on this week in over book slot (provider use) due to med hold and procedure date.

## 2022-08-15 NOTE — Telephone Encounter (Signed)
  Patient Consent for Virtual Visit        [name] has provided verbal consent on 08/15/2022 for a virtual visit (video or telephone).   CONSENT FOR VIRTUAL VISIT FOR:  [name]  By participating in this virtual visit I agree to the following:  I hereby voluntarily request, consent and authorize [facility] and its employed or contracted physicians, physician assistants, nurse practitioners or other licensed health care professionals (the Practitioner), to provide me with telemedicine health care services (the "Services") as deemed necessary by the treating Practitioner. I acknowledge and consent to receive the Services by the Practitioner via telemedicine. I understand that the telemedicine visit will involve communicating with the Practitioner through live audiovisual communication technology and the disclosure of certain medical information by electronic transmission. I acknowledge that I have been given the opportunity to request an in-person assessment or other available alternative prior to the telemedicine visit and am voluntarily participating in the telemedicine visit.  I understand that I have the right to withhold or withdraw my consent to the use of telemedicine in the course of my care at any time, without affecting my right to future care or treatment, and that the Practitioner or I may terminate the telemedicine visit at any time. I understand that I have the right to inspect all information obtained and/or recorded in the course of the telemedicine visit and may receive copies of available information for a reasonable fee.  I understand that some of the potential risks of receiving the Services via telemedicine include:  Delay or interruption in medical evaluation due to technological equipment failure or disruption; Information transmitted may not be sufficient (e.g. poor resolution of images) to allow for appropriate medical decision making by the Practitioner;  and/or  In rare instances, security protocols could fail, causing a breach of personal health information.  Furthermore, I acknowledge that it is my responsibility to provide information about my medical history, conditions and care that is complete and accurate to the best of my ability. I acknowledge that Practitioner's advice, recommendations, and/or decision may be based on factors not within their control, such as incomplete or inaccurate data provided by me or distortions of diagnostic images or specimens that may result from electronic transmissions. I understand that the practice of medicine is not an exact science and that Practitioner makes no warranties or guarantees regarding treatment outcomes. I acknowledge that a copy of this consent can be made available to me via my patient portal [facility] MyChart), or I can request a printed copy by calling the office of [facility].    I understand that my insurance will be billed for this visit.   I have read or had this consent read to me. I understand the contents of this consent, which adequately explains the benefits and risks of the Services being provided via telemedicine.  I have been provided ample opportunity to ask questions regarding this consent and the Services and have had my questions answered to my satisfaction. I give my informed consent for the services to be provided through the use of telemedicine in my medical care

## 2022-08-15 NOTE — Telephone Encounter (Signed)
Attempted to return pt's call after receiving a secure chat that patient called back. Received no answer.

## 2022-08-15 NOTE — Telephone Encounter (Signed)
Patient returned call, please call back to schedule appt.

## 2022-08-15 NOTE — Telephone Encounter (Signed)
Preop clearance scheduled 8/19/at 3:20 p.m., call [phone]. Mec rec and consent done. Slot time okay per APP.

## 2022-08-20 ENCOUNTER — Ambulatory Visit: Payer: Medicare HMO | Attending: Cardiology

## 2022-08-20 DIAGNOSIS — Z01810 Encounter for preprocedural cardiovascular examination: Secondary | ICD-10-CM
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
## 2022-10-22 ENCOUNTER — Other Ambulatory Visit: Payer: Self-pay | Admitting: Cardiology

## 2022-11-07 ENCOUNTER — Ambulatory Visit: Payer: Medicare HMO | Attending: Cardiology | Admitting: Cardiology

## 2022-11-07 ENCOUNTER — Encounter: Payer: Self-pay | Admitting: Cardiology

## 2022-11-07 VITALS — BP 112/68 | HR 74 | Ht 72.0 in | Wt 196.6 lb

## 2022-11-07 DIAGNOSIS — E782 Mixed hyperlipidemia: Secondary | ICD-10-CM | POA: Diagnosis not present

## 2022-11-07 DIAGNOSIS — I10 Essential (primary) hypertension: Secondary | ICD-10-CM

## 2022-11-07 DIAGNOSIS — R0989 Other specified symptoms and signs involving the circulatory and respiratory systems: Secondary | ICD-10-CM

## 2022-11-07 DIAGNOSIS — I6523 Occlusion and stenosis of bilateral carotid arteries: Secondary | ICD-10-CM

## 2022-11-07 DIAGNOSIS — I25119 Atherosclerotic heart disease of native coronary artery with unspecified angina pectoris: Secondary | ICD-10-CM

## 2022-11-07 MED ORDER — CLOPIDOGREL BISULFATE 75 MG PO TABS: 75.0000 mg | ORAL_TABLET | Freq: Every day | ORAL | 2 refills | Status: DC

## 2022-11-07 MED ORDER — AMLODIPINE BESYLATE 5 MG PO TABS: 5.0000 mg | ORAL_TABLET | Freq: Every day | ORAL | 2 refills | Status: DC

## 2022-11-07 MED ORDER — ATORVASTATIN CALCIUM 80 MG PO TABS: 80.0000 mg | ORAL_TABLET | Freq: Every day | ORAL | 2 refills | Status: DC

## 2022-11-07 MED ORDER — RAMIPRIL 10 MG PO CAPS: 10.0000 mg | ORAL_CAPSULE | Freq: Two times a day (BID) | ORAL | 2 refills | Status: DC

## 2022-11-07 MED ORDER — ISOSORBIDE MONONITRATE ER 60 MG PO TB24: 60.0000 mg | ORAL_TABLET | Freq: Every day | ORAL | 2 refills | Status: DC

## 2022-11-07 MED ORDER — METOPROLOL SUCCINATE ER 25 MG PO TB24: 25.0000 mg | ORAL_TABLET | Freq: Every day | ORAL | 2 refills | Status: DC

## 2022-11-07 MED ORDER — NITROGLYCERIN 0.4 MG SL SUBL: 0.4000 mg | SUBLINGUAL_TABLET | SUBLINGUAL | 3 refills | Status: DC | PRN
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
## 2022-11-07 NOTE — Patient Instructions (Addendum)
Medication Instructions:  Your physician recommends that you continue on your current medications as directed. Please refer to the Current Medication list given to you today.  Labwork: none  Testing/Procedures: Your physician has requested that you have a carotid duplex in May 2025. This test is an ultrasound of the carotid arteries in your neck. It looks at blood flow through these arteries that supply the brain with blood. Allow one hour for this exam. There are no restrictions or special instructions.  Follow-Up: Your physician recommends that you schedule a follow-up appointment in: 6 months  Any Other Special Instructions Will Be Listed Below (If Applicable).  If you need a refill on your cardiac medications before your next appointment, please call your pharmacy.

## 2023-05-07 ENCOUNTER — Ambulatory Visit (HOSPITAL_COMMUNITY): Admission: RE | Admit: 2023-05-07 | Payer: Medicare HMO | Source: Ambulatory Visit

## 2023-05-17 ENCOUNTER — Ambulatory Visit (HOSPITAL_COMMUNITY)
Admission: RE | Admit: 2023-05-17 | Discharge: 2023-05-17 | Disposition: A | Discharge status: Home / Self Care | Source: Ambulatory Visit | Attending: Cardiology | Admitting: Cardiology

## 2023-05-17 ENCOUNTER — Ambulatory Visit: Payer: Self-pay | Admitting: Nurse Practitioner

## 2023-05-17 DIAGNOSIS — R0989 Other specified symptoms and signs involving the circulatory and respiratory systems: Secondary | ICD-10-CM | POA: Diagnosis present

## 2023-05-17 DIAGNOSIS — I6523 Occlusion and stenosis of bilateral carotid arteries: Secondary | ICD-10-CM | POA: Diagnosis present

## 2023-05-22 ENCOUNTER — Encounter: Payer: Self-pay | Admitting: Nurse Practitioner

## 2023-05-22 ENCOUNTER — Ambulatory Visit: Attending: Nurse Practitioner | Admitting: Nurse Practitioner

## 2023-05-22 VITALS — BP 136/82 | HR 55 | Ht 72.0 in | Wt 197.0 lb

## 2023-05-22 DIAGNOSIS — Z720 Tobacco use: Secondary | ICD-10-CM

## 2023-05-22 DIAGNOSIS — E782 Mixed hyperlipidemia: Secondary | ICD-10-CM | POA: Diagnosis not present

## 2023-05-22 DIAGNOSIS — I10 Essential (primary) hypertension: Secondary | ICD-10-CM

## 2023-05-22 DIAGNOSIS — I2510 Atherosclerotic heart disease of native coronary artery without angina pectoris: Secondary | ICD-10-CM

## 2023-05-22 DIAGNOSIS — E663 Overweight: Secondary | ICD-10-CM

## 2023-05-22 DIAGNOSIS — R0989 Other specified symptoms and signs involving the circulatory and respiratory systems: Secondary | ICD-10-CM

## 2023-05-22 DIAGNOSIS — I6502 Occlusion and stenosis of left vertebral artery: Secondary | ICD-10-CM | POA: Diagnosis not present

## 2023-05-22 DIAGNOSIS — I25119 Atherosclerotic heart disease of native coronary artery with unspecified angina pectoris: Secondary | ICD-10-CM

## 2023-05-22 MED ORDER — METOPROLOL SUCCINATE ER 25 MG PO TB24: 12.5000 mg | ORAL_TABLET | Freq: Every day | ORAL | 2 refills | Status: AC

## 2023-05-22 MED ORDER — AMLODIPINE BESYLATE 10 MG PO TABS: 10.0000 mg | ORAL_TABLET | Freq: Every day | ORAL | 1 refills | Status: DC

## 2023-05-22 MED ORDER — NICOTINE 14 MG/24HR TD PT24: 14.0000 mg | MEDICATED_PATCH | Freq: Every day | TRANSDERMAL | 0 refills | Status: DC
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
## 2023-05-22 NOTE — Patient Instructions (Addendum)
 Medication Instructions:  Your physician has recommended you make the following change in your medication:  Please reduce Metoprolol  Succinate to 12.5 Mg daily  Please Increase Amlodipine  to 10 Mg daily  Please start Nicotine Patch (1) 14 Mg patch Daily   Labwork: None   Testing/Procedures: None   Follow-Up: Your physician recommends that you schedule a follow-up appointment in:  6-8 week nurse visit for a Blood pressure check  6 Months   Any Other Special Instructions Will Be Listed Below (If Applicable).   (Patient Name)-_____________________________  (MRN)-_________________________     (Physician)-_________________________________   (DATE) -_________ (Blood Pressure)-_________  (Heart Rate)-_________    (DATE) -_________ (Blood Pressure)-_________  (Heart Rate)-_________    (DATE) -_________ (Blood Pressure)-_________  (Heart Rate)-_________   (DATE) -_________ (Blood Pressure)-_________  (Heart Rate)-_________    (DATE) -_________ (Blood Pressure)-_________  (Heart Rate)-_________    (DATE) -_________ (Blood Pressure)-_________  (Heart Rate)-_________   (DATE) -_________ (Blood Pressure)-_________  (Heart Rate)-_________    (DATE) -_________ (Blood Pressure)-_________  (Heart Rate)-_________    (DATE) -_________ (Blood Pressure)-_________  (Heart Rate)-_________    (DATE) -_________ (Blood Pressure)-_________  (Heart Rate)-_________    (DATE) -_________ (Blood Pressure)-_________  (Heart Rate)-_________    (DATE) -_________ (Blood Pressure)-_________  (Heart Rate)-_________    (DATE) -_________ (Blood Pressure)-_________  (Heart Rate)-_________    (DATE) -_________ (Blood Pressure)-_________  (Heart Rate)-_________     If you need a refill on your cardiac medications before your next appointment, please call your pharmacy.

## 2023-05-30 NOTE — Telephone Encounter (Signed)
 Patient is returning call.

## 2023-07-02 ENCOUNTER — Telehealth: Payer: Self-pay | Admitting: Cardiology

## 2023-07-02 NOTE — Telephone Encounter (Signed)
 Patient wants to reschedule his Nurse Visit tomorrow (7/2).  Patient noted he will need an afternoon visit.  Patient stated can also leave him a voice message.

## 2023-07-03 ENCOUNTER — Ambulatory Visit

## 2023-07-15 ENCOUNTER — Ambulatory Visit: Attending: Internal Medicine

## 2023-07-15 DIAGNOSIS — I10 Essential (primary) hypertension: Secondary | ICD-10-CM

## 2023-07-15 NOTE — Progress Notes (Signed)
 Patient here for a 6-8 week nurse visit for a BP check per [facility]  Patient previous vitals at last OV was 132/82  HR 55 Patient vitals today is  116/74 HR 68 SPO2 99  Patient presents well just as he states  He states no CP, Weakness or SOB  Sent to provider for review

## 2023-08-22 ENCOUNTER — Other Ambulatory Visit: Payer: Self-pay | Admitting: Cardiology

## 2023-10-09 ENCOUNTER — Emergency Department (HOSPITAL_COMMUNITY)

## 2023-10-09 ENCOUNTER — Encounter (HOSPITAL_COMMUNITY): Payer: Self-pay | Admitting: Pharmacy Technician

## 2023-10-09 ENCOUNTER — Other Ambulatory Visit: Payer: Self-pay

## 2023-10-09 ENCOUNTER — Inpatient Hospital Stay (HOSPITAL_COMMUNITY)
Admission: EM | Admit: 2023-10-09 | Discharge: 2023-10-13 | DRG: 280 | Disposition: A | Discharge status: Home / Self Care | Attending: Cardiology | Admitting: Cardiology

## 2023-10-09 DIAGNOSIS — Z716 Tobacco abuse counseling: Secondary | ICD-10-CM | POA: Diagnosis not present

## 2023-10-09 DIAGNOSIS — I871 Compression of vein: Secondary | ICD-10-CM | POA: Diagnosis not present

## 2023-10-09 DIAGNOSIS — I10 Essential (primary) hypertension: Secondary | ICD-10-CM | POA: Diagnosis present

## 2023-10-09 DIAGNOSIS — I739 Peripheral vascular disease, unspecified: Secondary | ICD-10-CM | POA: Diagnosis present

## 2023-10-09 DIAGNOSIS — E782 Mixed hyperlipidemia: Secondary | ICD-10-CM | POA: Diagnosis present

## 2023-10-09 DIAGNOSIS — I25810 Atherosclerosis of coronary artery bypass graft(s) without angina pectoris: Secondary | ICD-10-CM | POA: Diagnosis present

## 2023-10-09 DIAGNOSIS — I214 Non-ST elevation (NSTEMI) myocardial infarction: Secondary | ICD-10-CM | POA: Diagnosis present

## 2023-10-09 DIAGNOSIS — Z7982 Long term (current) use of aspirin: Secondary | ICD-10-CM

## 2023-10-09 DIAGNOSIS — I2510 Atherosclerotic heart disease of native coronary artery without angina pectoris: Secondary | ICD-10-CM | POA: Diagnosis present

## 2023-10-09 DIAGNOSIS — I708 Atherosclerosis of other arteries: Secondary | ICD-10-CM | POA: Diagnosis present

## 2023-10-09 DIAGNOSIS — Y831 Surgical operation with implant of artificial internal device as the cause of abnormal reaction of the patient, or of later complication, without mention of misadventure at the time of the procedure: Secondary | ICD-10-CM | POA: Diagnosis present

## 2023-10-09 DIAGNOSIS — J449 Chronic obstructive pulmonary disease, unspecified: Secondary | ICD-10-CM | POA: Diagnosis present

## 2023-10-09 DIAGNOSIS — Z794 Long term (current) use of insulin: Secondary | ICD-10-CM

## 2023-10-09 DIAGNOSIS — Z7902 Long term (current) use of antithrombotics/antiplatelets: Secondary | ICD-10-CM

## 2023-10-09 DIAGNOSIS — I252 Old myocardial infarction: Secondary | ICD-10-CM

## 2023-10-09 DIAGNOSIS — Z79899 Other long term (current) drug therapy: Secondary | ICD-10-CM | POA: Diagnosis not present

## 2023-10-09 DIAGNOSIS — Z951 Presence of aortocoronary bypass graft: Secondary | ICD-10-CM | POA: Diagnosis not present

## 2023-10-09 DIAGNOSIS — E871 Hypo-osmolality and hyponatremia: Secondary | ICD-10-CM | POA: Diagnosis present

## 2023-10-09 DIAGNOSIS — Z89611 Acquired absence of right leg above knee: Secondary | ICD-10-CM

## 2023-10-09 DIAGNOSIS — I110 Hypertensive heart disease with heart failure: Secondary | ICD-10-CM | POA: Diagnosis present

## 2023-10-09 DIAGNOSIS — F17210 Nicotine dependence, cigarettes, uncomplicated: Secondary | ICD-10-CM | POA: Diagnosis present

## 2023-10-09 DIAGNOSIS — Z560 Unemployment, unspecified: Secondary | ICD-10-CM | POA: Diagnosis not present

## 2023-10-09 DIAGNOSIS — Z7951 Long term (current) use of inhaled steroids: Secondary | ICD-10-CM

## 2023-10-09 DIAGNOSIS — Z955 Presence of coronary angioplasty implant and graft: Secondary | ICD-10-CM

## 2023-10-09 DIAGNOSIS — I5031 Acute diastolic (congestive) heart failure: Secondary | ICD-10-CM | POA: Diagnosis present

## 2023-10-09 DIAGNOSIS — I771 Stricture of artery: Secondary | ICD-10-CM | POA: Insufficient documentation

## 2023-10-09 DIAGNOSIS — Z8249 Family history of ischemic heart disease and other diseases of the circulatory system: Secondary | ICD-10-CM | POA: Diagnosis not present

## 2023-10-09 DIAGNOSIS — T82855A Stenosis of coronary artery stent, initial encounter: Principal | ICD-10-CM | POA: Diagnosis present

## 2023-10-09 DIAGNOSIS — R079 Chest pain, unspecified: Secondary | ICD-10-CM | POA: Diagnosis not present

## 2023-10-09 DIAGNOSIS — E785 Hyperlipidemia, unspecified: Secondary | ICD-10-CM | POA: Diagnosis present

## 2023-10-09 LAB — CBC
HCT: 42.5 % (ref 39.0–52.0)
Hemoglobin: 14.7 g/dL (ref 13.0–17.0)
MCH: 33.5 pg (ref 26.0–34.0)
MCHC: 34.6 g/dL (ref 30.0–36.0)
MCV: 96.8 fL (ref 80.0–100.0)
Platelets: 93 K/uL — ABNORMAL LOW (ref 150–400)
RBC: 4.39 MIL/uL (ref 4.22–5.81)
RDW: 13.1 % (ref 11.5–15.5)
WBC: 6.4 K/uL (ref 4.0–10.5)
nRBC: 0 % (ref 0.0–0.2)

## 2023-10-09 LAB — BASIC METABOLIC PANEL WITH GFR
Anion gap: 14 (ref 5–15)
BUN: 5 mg/dL — ABNORMAL LOW (ref 6–20)
CO2: 20 mmol/L — ABNORMAL LOW (ref 22–32)
Calcium: 8.6 mg/dL — ABNORMAL LOW (ref 8.9–10.3)
Chloride: 97 mmol/L — ABNORMAL LOW (ref 98–111)
Creatinine, Ser: 0.8 mg/dL (ref 0.61–1.24)
GFR, Estimated: >60 mL/min (ref >60)
Glucose, Bld: 96 mg/dL (ref 70–99)
Potassium: 3.7 mmol/L (ref 3.5–5.1)
Sodium: 131 mmol/L — ABNORMAL LOW (ref 135–145)

## 2023-10-09 LAB — TROPONIN I (HIGH SENSITIVITY)
Troponin I (High Sensitivity): 69 ng/L — ABNORMAL HIGH (ref <18)
Troponin I (High Sensitivity): 91 ng/L — ABNORMAL HIGH (ref <18)

## 2023-10-09 LAB — BRAIN NATRIURETIC PEPTIDE: B Natriuretic Peptide: 236.2 pg/mL — ABNORMAL HIGH (ref 0.0–100.0)

## 2023-10-09 MED ORDER — ACETAMINOPHEN 325 MG PO TABS
650.0000 mg | ORAL_TABLET | ORAL | Status: DC | PRN
  Administered 2023-10-12: 650 mg via ORAL
  Filled 2023-10-09: qty 2

## 2023-10-09 MED ORDER — ASPIRIN 81 MG PO TABS
81.0000 mg | ORAL_TABLET | Freq: Every day | ORAL | Status: DC
Start: 2023-10-09 — End: 2023-10-09

## 2023-10-09 MED ORDER — FLUTICASONE FUROATE-VILANTEROL 200-25 MCG/ACT IN AEPB
1.0000 | INHALATION_SPRAY | Freq: Every day | RESPIRATORY_TRACT | Status: DC
  Administered 2023-10-10 – 2023-10-13 (×4): 1 via RESPIRATORY_TRACT
  Filled 2023-10-09: qty 28

## 2023-10-09 MED ORDER — ASPIRIN 300 MG RE SUPP
300.0000 mg | RECTAL | Status: AC
Start: 2023-10-09 — End: 2023-10-09

## 2023-10-09 MED ORDER — ESCITALOPRAM OXALATE 10 MG PO TABS
10.0000 mg | ORAL_TABLET | Freq: Every day | ORAL | Status: DC
  Administered 2023-10-10 – 2023-10-13 (×4): 10 mg via ORAL
  Filled 2023-10-09 (×4): qty 1

## 2023-10-09 MED ORDER — NITROGLYCERIN 0.4 MG SL SUBL: 0.4000 mg | SUBLINGUAL_TABLET | SUBLINGUAL | Status: DC | PRN

## 2023-10-09 MED ORDER — METOPROLOL SUCCINATE ER 25 MG PO TB24
12.5000 mg | ORAL_TABLET | Freq: Every day | ORAL | Status: DC
  Administered 2023-10-11 – 2023-10-13 (×3): 12.5 mg via ORAL
  Filled 2023-10-09 (×4): qty 1

## 2023-10-09 MED ORDER — CLOPIDOGREL BISULFATE 75 MG PO TABS
75.0000 mg | ORAL_TABLET | Freq: Every day | ORAL | Status: DC
  Administered 2023-10-10 – 2023-10-13 (×4): 75 mg via ORAL
  Filled 2023-10-09 (×4): qty 1

## 2023-10-09 MED ORDER — HEPARIN (PORCINE) 25000 UT/250ML-% IV SOLN
1400.0000 [IU]/h | INTRAVENOUS | Status: DC
  Administered 2023-10-09: 1100 [IU]/h via INTRAVENOUS
  Filled 2023-10-09 (×2): qty 250

## 2023-10-09 MED ORDER — ATORVASTATIN CALCIUM 80 MG PO TABS
80.0000 mg | ORAL_TABLET | Freq: Every day | ORAL | Status: DC
  Administered 2023-10-10 – 2023-10-13 (×4): 80 mg via ORAL
  Filled 2023-10-09 (×2): qty 1
  Filled 2023-10-09: qty 2
  Filled 2023-10-09: qty 1

## 2023-10-09 MED ORDER — HYDROCODONE-ACETAMINOPHEN 7.5-325 MG PO TABS
1.0000 | ORAL_TABLET | Freq: Four times a day (QID) | ORAL | Status: DC | PRN
  Administered 2023-10-10 – 2023-10-13 (×7): 1 via ORAL
  Filled 2023-10-09 (×8): qty 1

## 2023-10-09 MED ORDER — ASPIRIN 81 MG PO CHEW
324.0000 mg | CHEWABLE_TABLET | ORAL | Status: AC
  Administered 2023-10-09: 324 mg via ORAL
  Filled 2023-10-09: qty 4

## 2023-10-09 MED ORDER — FUROSEMIDE 10 MG/ML IJ SOLN
20.0000 mg | Freq: Once | INTRAMUSCULAR | Status: DC
Start: 2023-10-09 — End: 2023-10-10

## 2023-10-09 MED ORDER — GABAPENTIN 400 MG PO CAPS
800.0000 mg | ORAL_CAPSULE | Freq: Three times a day (TID) | ORAL | Status: DC
Start: 2023-10-09 — End: 2023-10-13
  Administered 2023-10-09 – 2023-10-13 (×11): 800 mg via ORAL
  Filled 2023-10-09 (×2): qty 2
  Filled 2023-10-09: qty 8
  Filled 2023-10-09 (×3): qty 2
  Filled 2023-10-09: qty 8
  Filled 2023-10-09 (×4): qty 2

## 2023-10-09 MED ORDER — ASPIRIN 81 MG PO TBEC
81.0000 mg | DELAYED_RELEASE_TABLET | Freq: Every day | ORAL | Status: DC
  Administered 2023-10-11 – 2023-10-13 (×3): 81 mg via ORAL
  Filled 2023-10-09 (×4): qty 1

## 2023-10-09 MED ORDER — HEPARIN BOLUS VIA INFUSION
4000.0000 [IU] | Freq: Once | INTRAVENOUS | Status: AC
  Administered 2023-10-09: 4000 [IU] via INTRAVENOUS
  Filled 2023-10-09: qty 4000

## 2023-10-09 MED ORDER — ALBUTEROL SULFATE (2.5 MG/3ML) 0.083% IN NEBU: 2.5000 mg | INHALATION_SOLUTION | RESPIRATORY_TRACT | Status: DC | PRN

## 2023-10-09 MED ORDER — ONDANSETRON HCL 4 MG/2ML IJ SOLN: 4.0000 mg | Freq: Four times a day (QID) | INTRAMUSCULAR | Status: DC | PRN

## 2023-10-09 MED ORDER — NITROGLYCERIN 0.4 MG SL SUBL
0.4000 mg | SUBLINGUAL_TABLET | SUBLINGUAL | Status: DC | PRN
  Administered 2023-10-09 – 2023-10-10 (×3): 0.4 mg via SUBLINGUAL
  Filled 2023-10-09 (×3): qty 1

## 2023-10-09 NOTE — ED Triage Notes (Signed)
 Left sided chest pain x4-5 days with tightness into his arms.  Pt states that he has taken sl nitroglycerin  with temporary relief.
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
## 2023-10-10 ENCOUNTER — Encounter (HOSPITAL_COMMUNITY)
Admission: EM | Disposition: A | Discharge status: Home / Self Care | Payer: Self-pay | Source: Home / Self Care | Attending: Cardiology

## 2023-10-10 ENCOUNTER — Other Ambulatory Visit (HOSPITAL_COMMUNITY): Payer: Self-pay

## 2023-10-10 ENCOUNTER — Telehealth (HOSPITAL_COMMUNITY): Payer: Self-pay | Admitting: Pharmacy Technician

## 2023-10-10 ENCOUNTER — Inpatient Hospital Stay (HOSPITAL_COMMUNITY)

## 2023-10-10 DIAGNOSIS — I2510 Atherosclerotic heart disease of native coronary artery without angina pectoris: Secondary | ICD-10-CM

## 2023-10-10 DIAGNOSIS — I214 Non-ST elevation (NSTEMI) myocardial infarction: Secondary | ICD-10-CM | POA: Diagnosis not present

## 2023-10-10 DIAGNOSIS — I25810 Atherosclerosis of coronary artery bypass graft(s) without angina pectoris: Secondary | ICD-10-CM | POA: Diagnosis not present

## 2023-10-10 DIAGNOSIS — R079 Chest pain, unspecified: Secondary | ICD-10-CM | POA: Diagnosis not present

## 2023-10-10 DIAGNOSIS — E871 Hypo-osmolality and hyponatremia: Secondary | ICD-10-CM | POA: Diagnosis not present

## 2023-10-10 DIAGNOSIS — I10 Essential (primary) hypertension: Secondary | ICD-10-CM | POA: Diagnosis not present

## 2023-10-10 HISTORY — PX: UPPER EXTREMITY ANGIOGRAPHY: CATH118270

## 2023-10-10 HISTORY — PX: LEFT HEART CATH AND CORS/GRAFTS ANGIOGRAPHY: CATH118250

## 2023-10-10 LAB — ECHOCARDIOGRAM COMPLETE
Area-P 1/2: 3.77 cm2
Calc EF: 53.7 %
Est EF: 55
Height: 73 in
S' Lateral: 3.7 cm
Single Plane A2C EF: 52.7 %
Single Plane A4C EF: 50.6 %
Weight: 3156.99 [oz_av]

## 2023-10-10 LAB — BASIC METABOLIC PANEL WITH GFR
Anion gap: 12 (ref 5–15)
BUN: 9 mg/dL (ref 6–20)
CO2: 22 mmol/L (ref 22–32)
Calcium: 8.5 mg/dL — ABNORMAL LOW (ref 8.9–10.3)
Chloride: 95 mmol/L — ABNORMAL LOW (ref 98–111)
Creatinine, Ser: 0.79 mg/dL (ref 0.61–1.24)
GFR, Estimated: >60 mL/min (ref >60)
Glucose, Bld: 134 mg/dL — ABNORMAL HIGH (ref 70–99)
Potassium: 3.6 mmol/L (ref 3.5–5.1)
Sodium: 129 mmol/L — ABNORMAL LOW (ref 135–145)

## 2023-10-10 LAB — CBC
HCT: 39.5 % (ref 39.0–52.0)
Hemoglobin: 14 g/dL (ref 13.0–17.0)
MCH: 34.1 pg — ABNORMAL HIGH (ref 26.0–34.0)
MCHC: 35.4 g/dL (ref 30.0–36.0)
MCV: 96.1 fL (ref 80.0–100.0)
Platelets: 74 K/uL — ABNORMAL LOW (ref 150–400)
RBC: 4.11 MIL/uL — ABNORMAL LOW (ref 4.22–5.81)
RDW: 13.2 % (ref 11.5–15.5)
WBC: 5.2 K/uL (ref 4.0–10.5)
nRBC: 0 % (ref 0.0–0.2)

## 2023-10-10 LAB — POCT ACTIVATED CLOTTING TIME
Activated Clotting Time: 256 s
Activated Clotting Time: 233 s

## 2023-10-10 LAB — HEPARIN LEVEL (UNFRACTIONATED)
Heparin Unfractionated: 0.25 [IU]/mL — ABNORMAL LOW (ref 0.30–0.70)
Heparin Unfractionated: 0.25 [IU]/mL — ABNORMAL LOW (ref 0.30–0.70)
Heparin Unfractionated: 0.25 [IU]/mL — ABNORMAL LOW (ref 0.30–0.70)

## 2023-10-10 LAB — LIPID PANEL
Cholesterol: 78 mg/dL (ref 0–200)
HDL: 38 mg/dL — ABNORMAL LOW (ref >40)
LDL Cholesterol: 32 mg/dL (ref 0–99)
Total CHOL/HDL Ratio: 2.1 ratio
Triglycerides: 41 mg/dL (ref <150)
VLDL: 8 mg/dL (ref 0–40)

## 2023-10-10 SURGERY — LEFT HEART CATH AND CORS/GRAFTS ANGIOGRAPHY
Anesthesia: LOCAL

## 2023-10-10 MED ORDER — FREE WATER
250.0000 mL | Freq: Once | Status: AC
  Administered 2023-10-10: 250 mL via ORAL

## 2023-10-10 MED ORDER — LABETALOL HCL 5 MG/ML IV SOLN: 10.0000 mg | INTRAVENOUS | Status: AC | PRN

## 2023-10-10 MED ORDER — HEPARIN SODIUM (PORCINE) 1000 UNIT/ML IJ SOLN
INTRAMUSCULAR | Status: DC | PRN
  Administered 2023-10-10: 4500 [IU] via INTRAVENOUS

## 2023-10-10 MED ORDER — HEPARIN SODIUM (PORCINE) 1000 UNIT/ML IJ SOLN
INTRAMUSCULAR | Status: AC
  Filled 2023-10-10: qty 10

## 2023-10-10 MED ORDER — SODIUM CHLORIDE 0.9% FLUSH: 3.0000 mL | INTRAVENOUS | Status: DC | PRN

## 2023-10-10 MED ORDER — ASPIRIN 81 MG PO CHEW: 81.0000 mg | CHEWABLE_TABLET | ORAL | Status: DC

## 2023-10-10 MED ORDER — MIDAZOLAM HCL 2 MG/2ML IJ SOLN
INTRAMUSCULAR | Status: DC | PRN
  Administered 2023-10-10: 1 mg via INTRAVENOUS

## 2023-10-10 MED ORDER — HEPARIN (PORCINE) IN NACL 1000-0.9 UT/500ML-% IV SOLN
INTRAVENOUS | Status: DC | PRN
  Administered 2023-10-10 (×2): 500 mL

## 2023-10-10 MED ORDER — PERFLUTREN LIPID MICROSPHERE
1.0000 mL | INTRAVENOUS | Status: AC | PRN
  Administered 2023-10-10: 2 mL via INTRAVENOUS

## 2023-10-10 MED ORDER — VERAPAMIL HCL 2.5 MG/ML IV SOLN
INTRAVENOUS | Status: AC
  Filled 2023-10-10: qty 2

## 2023-10-10 MED ORDER — ASPIRIN 81 MG PO CHEW
81.0000 mg | CHEWABLE_TABLET | ORAL | Status: AC
  Administered 2023-10-10: 81 mg via ORAL
  Filled 2023-10-10: qty 1

## 2023-10-10 MED ORDER — HEPARIN (PORCINE) IN NACL 2-0.9 UNITS/ML
INTRAMUSCULAR | Status: DC | PRN
  Administered 2023-10-10: 10 mL via INTRA_ARTERIAL

## 2023-10-10 MED ORDER — FREE WATER: 250.0000 mL | Freq: Once | Status: DC

## 2023-10-10 MED ORDER — SODIUM CHLORIDE 0.9 % IV SOLN: 250.0000 mL | INTRAVENOUS | Status: AC | PRN

## 2023-10-10 MED ORDER — SODIUM CHLORIDE 0.9% FLUSH
3.0000 mL | Freq: Two times a day (BID) | INTRAVENOUS | Status: DC
  Administered 2023-10-10 – 2023-10-13 (×4): 3 mL via INTRAVENOUS

## 2023-10-10 MED ORDER — FUROSEMIDE 10 MG/ML IJ SOLN
20.0000 mg | Freq: Once | INTRAMUSCULAR | Status: AC
  Administered 2023-10-10: 20 mg via INTRAVENOUS
  Filled 2023-10-10: qty 2

## 2023-10-10 MED ORDER — IOHEXOL 350 MG/ML SOLN
INTRAVENOUS | Status: DC | PRN
  Administered 2023-10-10: 80 mL

## 2023-10-10 MED ORDER — HEPARIN (PORCINE) 25000 UT/250ML-% IV SOLN
1500.0000 [IU]/h | INTRAVENOUS | Status: AC
  Administered 2023-10-10: 1400 [IU]/h via INTRAVENOUS
  Administered 2023-10-11 – 2023-10-12 (×2): 1500 [IU]/h via INTRAVENOUS
  Filled 2023-10-10 (×3): qty 250

## 2023-10-10 MED ORDER — FENTANYL CITRATE (PF) 100 MCG/2ML IJ SOLN
INTRAMUSCULAR | Status: AC
  Filled 2023-10-10: qty 2

## 2023-10-10 MED ORDER — HYDRALAZINE HCL 20 MG/ML IJ SOLN: 10.0000 mg | INTRAMUSCULAR | Status: AC | PRN

## 2023-10-10 MED ORDER — LIDOCAINE HCL (PF) 1 % IJ SOLN
INTRAMUSCULAR | Status: DC | PRN
  Administered 2023-10-10 (×2): 2 mL

## 2023-10-10 MED ORDER — FENTANYL CITRATE (PF) 100 MCG/2ML IJ SOLN
INTRAMUSCULAR | Status: DC | PRN
  Administered 2023-10-10: 25 ug via INTRAVENOUS

## 2023-10-10 MED ORDER — MIDAZOLAM HCL 2 MG/2ML IJ SOLN
INTRAMUSCULAR | Status: AC
  Filled 2023-10-10: qty 2

## 2023-10-10 SURGICAL SUPPLY — 15 items
CATH INFINITI 5 FR IM (CATHETERS) IMPLANT
CATH INFINITI 5 FR MPA2 (CATHETERS) IMPLANT
CATH INFINITI 5FR JL4 (CATHETERS) IMPLANT
CATH LAUNCHER 5F AL1 (CATHETERS) IMPLANT
DEVICE CLOSURE MYNXGRIP 5F (Vascular Products) IMPLANT
DEVICE RAD COMP TR BAND LRG (VASCULAR PRODUCTS) IMPLANT
GLIDESHEATH SLEND SS 6F .021 (SHEATH) IMPLANT
GUIDEWIRE ANGLED .035X150CM (WIRE) IMPLANT
GUIDEWIRE INQWIRE 1.5J.035X260 (WIRE) IMPLANT
KIT MICROPUNCTURE NIT STIFF (SHEATH) IMPLANT
PACK CARDIAC CATHETERIZATION (CUSTOM PROCEDURE TRAY) ×3 IMPLANT
SET ATX-X65L (MISCELLANEOUS) IMPLANT
SHEATH PINNACLE 5F 10CM (SHEATH) IMPLANT
SHEATH PROBE COVER 6X72 (BAG) IMPLANT
WIRE HI TORQ VERSACORE-J 145CM (WIRE) IMPLANT

## 2023-10-10 NOTE — Progress Notes (Addendum)
 PHARMACY - ANTICOAGULATION CONSULT NOTE  Pharmacy Consult for heparin Indication: chest pain/ACS  No Known Allergies  Patient Measurements: Height: 6' 1 (185.4 cm) Weight: 89.5 kg (197 lb 5 oz) IBW/kg (Calculated) : 79.9 HEPARIN DW (KG): 89.5Last weight found in chart ~90 kg  Vital Signs: Temp: 98 F (36.7 C) (10/09 1753) Temp Source: Oral (10/09 1753) BP: 131/73 (10/09 1753) Pulse Rate: 64 (10/09 1753)  Labs: Recent Labs    10/09/23 1540 10/09/23 1739 10/10/23 0008 10/10/23 0441 10/10/23 0800 10/10/23 1044  HGB 14.7  --   --  14.0  --   --   HCT 42.5  --   --  39.5  --   --   PLT 93*  --   --  74*  --   --   HEPARINUNFRC  --   --  0.25*  --  0.25* 0.25*  CREATININE 0.80  --   --  0.79  --   --   TROPONINIHS 69* 91*  --   --   --   --    Estimated Creatinine Clearance: 116.5 mL/min (by C-G formula based on SCr of 0.79 mg/dL).  Assessment: Patient is a 59 YO male presenting with 4-5 days of L-sided chest pain and tightness in arms relieved by SL nitroglycerin . Initial troponin 69. Pharmacy consulted to dose heparin for chest pain/ACS. Patient not on anticoagulation PTA. Initial Hgb WNL; plt somewhat low at 93.   Heparin level 0.25 is slightly subtherapeutic with heparin running at 1300 units/hr. Hgb (14.0) is stable and PLTs (74) are low stable. Per RN, no report of pauses, issues with the line, or signs of bleeding.    Goal of Therapy:  Heparin level 0.3-0.7 units/ml Monitor platelets by anticoagulation protocol: Yes   Plan:   -Increase heparin infusion to 1400 units/h.  -Check 6 hour heparin level.  -Monitor heparin levels, CBC daily -F/u [facility] plans after LHC today  Thank you for allowing pharmacy to be a part of this patient's care.   Nidia Schaffer, PharmD PGY2 Cardiology Pharmacy Resident  Please check AMION for all [facility] Pharmacy phone numbers After 10:00 PM, call Main Pharmacy [phone] 10/10/2023 5:58 PM     ________________________________________  10/9 PM update: s/p LHC revealing chronically occluded SVG-rPDA, patent but severe dz of SVG-D1 (99% mid graft stenosis w/ heavy thrombus burden), patent LCx/OM stent (40% ISR), 90% ISR of mid Lcx.  Other grafts patent.   Unable to intervene on SVG-D1, possible outpatient referral to peripheral vascular team for L subclavian artery intervention to improve LIMA-LAD perfusion.  Heparin to start 4h after Mynx device deployed (not charted but confirmed ~1730 with Dr. Mady) for 48h med management.  Adding Imdur .   Plan - Restart heparin 1400 units/hr (HL 0.25 subtherapeutic on 1300 units/hr) at ~2200.  6h heparin level.  F/u stop 10/11 @2200  for 48h duration.  Maurilio Fila, PharmD Clinical Pharmacist 10/10/2023  6:07 PM

## 2023-10-10 NOTE — ED Notes (Signed)
 Per cath lab, OK to give beta blocker and aspirin/plavix  now. Cath scheduled for around 1500 currently.

## 2023-10-10 NOTE — Progress Notes (Signed)
 PHARMACY - ANTICOAGULATION CONSULT NOTE  Pharmacy Consult for heparin Indication: chest pain/ACS  No Known Allergies  Patient Measurements: Height: 6' 1 (185.4 cm) Weight: 89.5 kg (197 lb 5 oz) IBW/kg (Calculated) : 79.9 HEPARIN DW (KG): 89.5Last weight found in chart ~90 kg  Vital Signs: Temp: 97.6 F (36.4 C) (10/09 0626) Temp Source: Oral (10/09 0626) BP: 113/85 (10/09 0645) Pulse Rate: 64 (10/09 0645)  Labs: Recent Labs    10/09/23 1540 10/09/23 1739 10/10/23 0008 10/10/23 0441  HGB 14.7  --   --  14.0  HCT 42.5  --   --  39.5  PLT 93*  --   --  74*  HEPARINUNFRC  --   --  0.25*  --   CREATININE 0.80  --   --  0.79  TROPONINIHS 69* 91*  --   --    Estimated Creatinine Clearance: 116.5 mL/min (by C-G formula based on SCr of 0.79 mg/dL).  Assessment: Patient is a 58 YO male presenting with 4-5 days of L-sided chest pain and tightness in arms relieved by SL nitroglycerin . Initial troponin 69. Pharmacy consulted to dose heparin for chest pain/ACS. Patient not on anticoagulation PTA. Initial Hgb WNL; plt somewhat low at 93.   Heparin level 0.25 is slightly subtherapeutic with heparin running at 1300 units/hr. Hgb (14.0) is stable and PLTs (74) are low stable. Per RN, no report of pauses, issues with the line, or signs of bleeding.    Goal of Therapy:  Heparin level 0.3-0.7 units/ml Monitor platelets by anticoagulation protocol: Yes   Plan:   -Increase heparin infusion to 1400 units/h.  -Check 6 hour heparin level.  -Monitor heparin levels, CBC daily -F/u [facility] plans after LHC today  Thank you for allowing pharmacy to be a part of this patient's care.   Nidia Schaffer, PharmD PGY2 Cardiology Pharmacy Resident  Please check AMION for all [facility] Pharmacy phone numbers After 10:00 PM, call Main Pharmacy [phone] 10/10/2023 8:32 AM
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
## 2023-10-10 NOTE — ED Notes (Signed)
 Echo at bedside

## 2023-10-10 NOTE — Progress Notes (Signed)
 PHARMACY - ANTICOAGULATION CONSULT NOTE  Pharmacy Consult for heparin Indication: chest pain/ACS  No Known Allergies  Patient Measurements:  Last weight found in chart ~90 kg  Vital Signs: Temp: 98.4 F (36.9 C) (10/08 2226) Temp Source: Oral (10/08 2226) BP: 106/82 (10/09 0015) Pulse Rate: 71 (10/09 0015)  Labs: Recent Labs    10/09/23 1540 10/09/23 1739 10/10/23 0008  HGB 14.7  --   --   HCT 42.5  --   --   PLT 93*  --   --   HEPARINUNFRC  --   --  0.25*  CREATININE 0.80  --   --   TROPONINIHS 69* 91*  --    CrCl cannot be calculated (Unknown ideal weight.).  Assessment: Patient is a 58 YO male presenting with 4-5 days of L-sided chest pain and tightness in arms relieved by SL nitroglycerin . Initial troponin 69. Pharmacy consulted to dose heparin for chest pain/ACS. Patient not on anticoagulation PTA. Initial Hgb WNL; plt somewhat low at 93.   AM: heparin level slightly below goal on 1100 units/hr. Per RN, no issues with the heparin gtt running continuously or signs/symptoms of bleeding.  Goal of Therapy:  Heparin level 0.3-0.7 units/ml Monitor platelets by anticoagulation protocol: Yes   Plan:   -Increase heparin infusion to 1300 units/h.  -Check 6 hour heparin level.  -Monitor heparin levels, CBC daily -F/u plans for LHC on 10/9  Lynwood Poplar, PharmD, BCPS Clinical Pharmacist 10/10/2023 12:31 AM

## 2023-10-10 NOTE — Plan of Care (Signed)
   Problem: Education: Goal: Understanding of cardiac disease, CV risk reduction, and recovery process will improve Outcome: Progressing Goal: Individualized Educational Video(s) Outcome: Progressing   Problem: Activity: Goal: Ability to tolerate increased activity will improve Outcome: Progressing   Problem: Cardiac: Goal: Ability to achieve and maintain adequate cardiovascular perfusion will improve Outcome: Progressing   Problem: Health Behavior/Discharge Planning: Goal: Ability to safely manage health-related needs after discharge will improve Outcome: Progressing   Problem: Education: Goal: Understanding of CV disease, CV risk reduction, and recovery process will improve Outcome: Progressing Goal: Individualized Educational Video(s) Outcome: Progressing   Problem: Activity: Goal: Ability to return to baseline activity level will improve Outcome: Progressing   Problem: Cardiovascular: Goal: Ability to achieve and maintain adequate cardiovascular perfusion will improve Outcome: Progressing Goal: Vascular access site(s) Level 0-1 will be maintained Outcome: Progressing   Problem: Health Behavior/Discharge Planning: Goal: Ability to safely manage health-related needs after discharge will improve Outcome: Progressing   Problem: Education: Goal: Knowledge of General Education information will improve Description: Including pain rating scale, medication(s)/side effects and non-pharmacologic comfort measures Outcome: Progressing   Problem: Health Behavior/Discharge Planning: Goal: Ability to manage health-related needs will improve Outcome: Progressing   Problem: Clinical Measurements: Goal: Ability to maintain clinical measurements within normal limits will improve Outcome: Progressing Goal: Will remain free from infection Outcome: Progressing Goal: Diagnostic test results will improve Outcome: Progressing Goal: Respiratory complications will improve Outcome:  Progressing Goal: Cardiovascular complication will be avoided Outcome: Progressing   Problem: Activity: Goal: Risk for activity intolerance will decrease Outcome: Progressing   Problem: Nutrition: Goal: Adequate nutrition will be maintained Outcome: Progressing   Problem: Coping: Goal: Level of anxiety will decrease Outcome: Progressing   Problem: Elimination: Goal: Will not experience complications related to bowel motility Outcome: Progressing Goal: Will not experience complications related to urinary retention Outcome: Progressing   Problem: Pain Managment: Goal: General experience of comfort will improve and/or be controlled Outcome: Progressing   Problem: Safety: Goal: Ability to remain free from injury will improve Outcome: Progressing   Problem: Skin Integrity: Goal: Risk for impaired skin integrity will decrease Outcome: Progressing

## 2023-10-10 NOTE — Progress Notes (Signed)
  Echocardiogram 2D Echocardiogram has been performed.  [name] 10/10/2023, 10:45 AM

## 2023-10-10 NOTE — Telephone Encounter (Signed)
 Patient Product/process development scientist completed.    The patient is insured through [facility]. Patient has Medicare and is not eligible for a copay card, but may be able to apply for patient assistance or Medicare RX Payment Plan (Patient Must reach out to their plan, if eligible for payment plan), if available.    Ran test claim for Brilinta 90 mg and the current 30 day co-pay is $0.00.  Ran test claim for prasugrel 10 mg and the current 30 day co-pay is $0.00.  This test claim was processed through [facility]- copay amounts may vary at other pharmacies due to pharmacy/plan contracts, or as the patient moves through the different stages of their insurance plan.     Reyes Sharps, CPHT Pharmacy Technician Patient Advocate Specialist [facility] Patient Advocate Team Direct Number: [phone]  Fax: [phone]

## 2023-10-10 NOTE — Interval H&P Note (Signed)
 History and Physical Interval Note:  10/10/2023 3:59 PM  [name]  has presented today for surgery, with the diagnosis of NSTEMI.  The various methods of treatment have been discussed with the patient and family. After consideration of risks, benefits and other options for treatment, the patient has consented to  Procedure(s): LEFT HEART CATH AND CORS/GRAFTS ANGIOGRAPHY (N/A) as a surgical intervention.  The patient's history has been reviewed, patient examined, no change in status, stable for surgery.  I have reviewed the patient's chart and labs.  Questions were answered to the patient's satisfaction.    Cath Lab Visit (complete for each Cath Lab visit)  Clinical Evaluation Leading to the Procedure:   ACS: Yes.    Non-ACS:  N/A  [name]
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
## 2023-10-11 ENCOUNTER — Encounter (HOSPITAL_COMMUNITY): Payer: Self-pay | Admitting: Internal Medicine

## 2023-10-11 DIAGNOSIS — I871 Compression of vein: Secondary | ICD-10-CM

## 2023-10-11 DIAGNOSIS — I214 Non-ST elevation (NSTEMI) myocardial infarction: Secondary | ICD-10-CM | POA: Diagnosis not present

## 2023-10-11 DIAGNOSIS — E871 Hypo-osmolality and hyponatremia: Secondary | ICD-10-CM

## 2023-10-11 LAB — CBC
HCT: 41.4 % (ref 39.0–52.0)
Hemoglobin: 14.8 g/dL (ref 13.0–17.0)
MCH: 33.9 pg (ref 26.0–34.0)
MCHC: 35.7 g/dL (ref 30.0–36.0)
MCV: 95 fL (ref 80.0–100.0)
Platelets: 66 K/uL — ABNORMAL LOW (ref 150–400)
RBC: 4.36 MIL/uL (ref 4.22–5.81)
RDW: 13 % (ref 11.5–15.5)
WBC: 4.5 K/uL (ref 4.0–10.5)
nRBC: 0 % (ref 0.0–0.2)

## 2023-10-11 LAB — MISC LABCORP TEST (SEND OUT): Labcorp test code: 83935

## 2023-10-11 LAB — BASIC METABOLIC PANEL WITH GFR
Anion gap: 12 (ref 5–15)
BUN: 9 mg/dL (ref 6–20)
CO2: 23 mmol/L (ref 22–32)
Calcium: 8.7 mg/dL — ABNORMAL LOW (ref 8.9–10.3)
Chloride: 92 mmol/L — ABNORMAL LOW (ref 98–111)
Creatinine, Ser: 0.77 mg/dL (ref 0.61–1.24)
GFR, Estimated: >60 mL/min (ref >60)
Glucose, Bld: 111 mg/dL — ABNORMAL HIGH (ref 70–99)
Potassium: 3.4 mmol/L — ABNORMAL LOW (ref 3.5–5.1)
Sodium: 127 mmol/L — ABNORMAL LOW (ref 135–145)

## 2023-10-11 LAB — HEPARIN LEVEL (UNFRACTIONATED)
Heparin Unfractionated: 0.26 [IU]/mL — ABNORMAL LOW (ref 0.30–0.70)
Heparin Unfractionated: 0.44 [IU]/mL (ref 0.30–0.70)
Heparin Unfractionated: 0.79 [IU]/mL — ABNORMAL HIGH (ref 0.30–0.70)
Heparin Unfractionated: 0.45 [IU]/mL (ref 0.30–0.70)

## 2023-10-11 LAB — LIPOPROTEIN A (LPA): Lipoprotein (a): 46.4 nmol/L — ABNORMAL HIGH (ref <75.0)

## 2023-10-11 MED ORDER — ISOSORBIDE MONONITRATE ER 60 MG PO TB24
60.0000 mg | ORAL_TABLET | Freq: Every day | ORAL | Status: DC
  Administered 2023-10-11 – 2023-10-13 (×3): 60 mg via ORAL
  Filled 2023-10-11 (×3): qty 1

## 2023-10-11 MED ORDER — AMLODIPINE BESYLATE 5 MG PO TABS
5.0000 mg | ORAL_TABLET | Freq: Every day | ORAL | Status: DC
  Administered 2023-10-11 – 2023-10-13 (×3): 5 mg via ORAL
  Filled 2023-10-11 (×3): qty 1

## 2023-10-11 MED ORDER — POTASSIUM CHLORIDE CRYS ER 20 MEQ PO TBCR
40.0000 meq | EXTENDED_RELEASE_TABLET | Freq: Once | ORAL | Status: AC
  Administered 2023-10-11: 40 meq via ORAL
  Filled 2023-10-11: qty 2

## 2023-10-11 NOTE — Progress Notes (Signed)
 PHARMACY - ANTICOAGULATION CONSULT NOTE  Pharmacy Consult for heparin Indication: chest pain/ACS  No Known Allergies  Patient Measurements: Height: 6' 1 (185.4 cm) Weight: 89.5 kg (197 lb 5 oz) IBW/kg (Calculated) : 79.9 HEPARIN DW (KG): 89.5Last weight found in chart ~90 kg  Vital Signs: Temp: 98.1 F (36.7 C) (10/10 0851) Temp Source: Oral (10/10 0851) BP: 130/66 (10/10 0851) Pulse Rate: 67 (10/10 0851)  Labs: Recent Labs    10/09/23 1540 10/09/23 1739 10/10/23 0008 10/10/23 0441 10/10/23 0800 10/10/23 1044 10/11/23 0430 10/11/23 1246  HGB 14.7  --   --  14.0  --   --  14.8  --   HCT 42.5  --   --  39.5  --   --  41.4  --   PLT 93*  --   --  74*  --   --  66*  --   HEPARINUNFRC  --   --    < >  --    < > 0.25* 0.26* 0.44  CREATININE 0.80  --   --  0.79  --   --  0.77  --   TROPONINIHS 69* 91*  --   --   --   --   --   --    < > = values in this interval not displayed.   Estimated Creatinine Clearance: 116.5 mL/min (by C-G formula based on SCr of 0.77 mg/dL).  Assessment: Patient is a 58 YO male presenting with 4-5 days of L-sided chest pain and tightness in arms relieved by SL nitroglycerin . Troponin 69>91. Pharmacy consulted to dose heparin for chest pain/ACS. Patient not on anticoagulation PTA.   Patient now s/p LHC 10/9 that showed severe native coronary artery disease, but no PCI targets. Medical management recommended. Will continue with heparin x48 hours until 10/11 @ 2200.   Heparin level therapeutic today at 0.44 on heparin 1500 units/hr for 6 hours. Hgb WNL (14.8). PLT have been downtrending, now 66, but he appears to have a low baseline PLT. Will continue to monitor.   Goal of Therapy:  Heparin level 0.3-0.7 units/ml Monitor platelets by anticoagulation protocol: Yes   Plan:   -Continue heparin 1500 units/hr -Collect a 6-hour anti-Xa level -Monitor platelet trend -Monitor s/sx bleeding, Hgb  Izetta Carl, PharmD PGY1 Pharmacy Resident

## 2023-10-11 NOTE — Progress Notes (Signed)
 PHARMACY - ANTICOAGULATION CONSULT NOTE  Pharmacy Consult for heparin Indication: NSTEMI  Labs: Recent Labs    10/09/23 1540 10/09/23 1739 10/10/23 0008 10/10/23 0441 10/10/23 0800 10/10/23 1044 10/11/23 0430  HGB 14.7  --   --  14.0  --   --  14.8  HCT 42.5  --   --  39.5  --   --  41.4  PLT 93*  --   --  74*  --   --  66*  HEPARINUNFRC  --   --    < >  --  0.25* 0.25* 0.26*  CREATININE 0.80  --   --  0.79  --   --  0.77  TROPONINIHS 69* 91*  --   --   --   --   --    < > = values in this interval not displayed.   Assessment: 58yo male subtherapeutic on heparin after resumed as medical management post cath; no infusion issues or signs of bleeding per RN.  Goal of Therapy:  Heparin level 0.3-0.7 units/ml   Plan:  Increase heparin infusion by 1 unit/kg/hr to 1500 units/hr. Check level in 6 hours.   Marvetta Dauphin, PharmD, BCPS 10/11/2023 5:38 AM
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
## 2023-10-11 NOTE — Progress Notes (Signed)
  pt info scanned into chart via fax.

## 2023-10-11 NOTE — Progress Notes (Incomplete)
 PHARMACY - ANTICOAGULATION CONSULT NOTE  Pharmacy Consult for heparin Indication: chest pain/ACS  No Known Allergies  Patient Measurements: Height: 6' 1 (185.4 cm) Weight: 89.5 kg (197 lb 5 oz) IBW/kg (Calculated) : 79.9 HEPARIN DW (KG): 89.5 Last weight found in chart ~90 kg  Vital Signs: Temp: 98.1 F (36.7 C) (10/10 1926) Temp Source: Oral (10/10 1926) BP: 114/71 (10/10 1926)  Labs: Recent Labs    10/09/23 1540 10/09/23 1739 10/10/23 0008 10/10/23 0441 10/10/23 0800 10/11/23 0430 10/11/23 1246 10/11/23 2043  HGB 14.7  --   --  14.0  --  14.8  --   --   HCT 42.5  --   --  39.5  --  41.4  --   --   PLT 93*  --   --  74*  --  66*  --   --   HEPARINUNFRC  --   --    < >  --    < > 0.26* 0.44 0.79*  CREATININE 0.80  --   --  0.79  --  0.77  --   --   TROPONINIHS 69* 91*  --   --   --   --   --   --    < > = values in this interval not displayed.   Estimated Creatinine Clearance: 116.5 mL/min (by C-G formula based on SCr of 0.77 mg/dL).  Assessment: Patient is a 58 YO male presenting with 4-5 days of L-sided chest pain and tightness in arms relieved by SL nitroglycerin . Troponin 69>91. Pharmacy consulted to dose heparin for chest pain/ACS. Patient not on anticoagulation PTA.   Patient now s/p LHC 10/9 that showed severe native coronary artery disease, but no PCI targets. Medical management recommended. Will continue with heparin x48 hours until 10/11 @ 2200.   Heparin level therapeutic today at 0.44 on heparin 1500 units/hr for 6 hours. Hgb WNL (14.8). PLT have been downtrending, now 66, but he appears to have a low baseline PLT. Will continue to monitor.   Goal of Therapy:  Heparin level 0.3-0.7 units/ml Monitor platelets by anticoagulation protocol: Yes   Plan:   -Continue heparin 1500 units/hr -Collect a 6-hour anti-Xa level -Monitor platelet trend -Monitor s/sx bleeding, Hgb  Izetta Carl, PharmD PGY1 Pharmacy Resident  ____________  10/10 PM update -  confirmatory HL 0.79 drawn from same arm as heparin infusion.  Repeat HL *** on 1500 units (HL 0.44 earlier today).

## 2023-10-11 NOTE — Plan of Care (Signed)
  Problem: Education: Goal: Understanding of cardiac disease, CV risk reduction, and recovery process will improve Outcome: Progressing Goal: Individualized Educational Video(s) Outcome: Progressing   Problem: Activity: Goal: Ability to tolerate increased activity will improve Outcome: Progressing   Problem: Cardiac: Goal: Ability to achieve and maintain adequate cardiovascular perfusion will improve Outcome: Progressing   Problem: Health Behavior/Discharge Planning: Goal: Ability to safely manage health-related needs after discharge will improve Outcome: Progressing   Problem: Education: Goal: Understanding of CV disease, CV risk reduction, and recovery process will improve Outcome: Progressing Goal: Individualized Educational Video(s) Outcome: Progressing   Problem: Activity: Goal: Ability to return to baseline activity level will improve Outcome: Progressing   Problem: Cardiovascular: Goal: Ability to achieve and maintain adequate cardiovascular perfusion will improve Outcome: Progressing   Problem: Health Behavior/Discharge Planning: Goal: Ability to safely manage health-related needs after discharge will improve Outcome: Progressing   Problem: Education: Goal: Knowledge of General Education information will improve Description: Including pain rating scale, medication(s)/side effects and non-pharmacologic comfort measures Outcome: Progressing   Problem: Health Behavior/Discharge Planning: Goal: Ability to manage health-related needs will improve Outcome: Progressing   Problem: Clinical Measurements: Goal: Ability to maintain clinical measurements within normal limits will improve Outcome: Progressing Goal: Will remain free from infection Outcome: Progressing Goal: Diagnostic test results will improve Outcome: Progressing Goal: Respiratory complications will improve Outcome: Progressing Goal: Cardiovascular complication will be avoided Outcome: Progressing    Problem: Activity: Goal: Risk for activity intolerance will decrease Outcome: Progressing   Problem: Nutrition: Goal: Adequate nutrition will be maintained Outcome: Progressing   Problem: Coping: Goal: Level of anxiety will decrease Outcome: Progressing   Problem: Elimination: Goal: Will not experience complications related to bowel motility Outcome: Progressing Goal: Will not experience complications related to urinary retention Outcome: Progressing   Problem: Pain Managment: Goal: General experience of comfort will improve and/or be controlled Outcome: Progressing   Problem: Safety: Goal: Ability to remain free from injury will improve Outcome: Progressing   Problem: Skin Integrity: Goal: Risk for impaired skin integrity will decrease Outcome: Progressing

## 2023-10-11 NOTE — Plan of Care (Signed)
  Problem: Cardiovascular: Goal: Ability to achieve and maintain adequate cardiovascular perfusion will improve Outcome: Progressing Goal: Vascular access site(s) Level 0-1 will be maintained Outcome: Completed/Met

## 2023-10-12 DIAGNOSIS — E871 Hypo-osmolality and hyponatremia: Secondary | ICD-10-CM | POA: Insufficient documentation

## 2023-10-12 DIAGNOSIS — I771 Stricture of artery: Secondary | ICD-10-CM | POA: Insufficient documentation

## 2023-10-12 LAB — BASIC METABOLIC PANEL WITH GFR
Anion gap: 13 (ref 5–15)
BUN: 9 mg/dL (ref 6–20)
CO2: 22 mmol/L (ref 22–32)
Calcium: 8.6 mg/dL — ABNORMAL LOW (ref 8.9–10.3)
Chloride: 94 mmol/L — ABNORMAL LOW (ref 98–111)
Creatinine, Ser: 0.75 mg/dL (ref 0.61–1.24)
GFR, Estimated: >60 mL/min (ref >60)
Glucose, Bld: 112 mg/dL — ABNORMAL HIGH (ref 70–99)
Potassium: 3.8 mmol/L (ref 3.5–5.1)
Sodium: 129 mmol/L — ABNORMAL LOW (ref 135–145)

## 2023-10-12 LAB — CBC
HCT: 39.5 % (ref 39.0–52.0)
Hemoglobin: 13.9 g/dL (ref 13.0–17.0)
MCH: 33.8 pg (ref 26.0–34.0)
MCHC: 35.2 g/dL (ref 30.0–36.0)
MCV: 96.1 fL (ref 80.0–100.0)
Platelets: 68 K/uL — ABNORMAL LOW (ref 150–400)
RBC: 4.11 MIL/uL — ABNORMAL LOW (ref 4.22–5.81)
RDW: 12.7 % (ref 11.5–15.5)
WBC: 3.8 K/uL — ABNORMAL LOW (ref 4.0–10.5)
nRBC: 0 % (ref 0.0–0.2)

## 2023-10-12 LAB — CORTISOL: Cortisol, Plasma: 4.6 ug/dL

## 2023-10-12 LAB — OSMOLALITY, URINE: Osmolality, Ur: 198 mosm/kg — ABNORMAL LOW (ref 300–900)

## 2023-10-12 LAB — TSH: TSH: 5.106 u[IU]/mL — ABNORMAL HIGH (ref 0.350–4.500)

## 2023-10-12 LAB — SODIUM, URINE, RANDOM: Sodium, Ur: <30 mmol/L

## 2023-10-12 MED ORDER — COSYNTROPIN 0.25 MG IJ SOLR
0.2500 mg | Freq: Once | INTRAMUSCULAR | Status: AC
Start: 2023-10-13 — End: 2023-10-13
  Administered 2023-10-13: 0.25 mg via INTRAVENOUS
  Filled 2023-10-12: qty 0.25

## 2023-10-12 MED ORDER — CLOPIDOGREL BISULFATE 75 MG PO TABS: 75.0000 mg | ORAL_TABLET | Freq: Every day | ORAL | 3 refills | Status: AC
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
## 2023-10-12 NOTE — Plan of Care (Signed)
  Problem: Education: Goal: Understanding of cardiac disease, CV risk reduction, and recovery process will improve Outcome: Progressing Goal: Individualized Educational Video(s) Outcome: Progressing   Problem: Activity: Goal: Ability to tolerate increased activity will improve Outcome: Progressing   Problem: Cardiac: Goal: Ability to achieve and maintain adequate cardiovascular perfusion will improve Outcome: Progressing   Problem: Health Behavior/Discharge Planning: Goal: Ability to safely manage health-related needs after discharge will improve Outcome: Progressing   Problem: Education: Goal: Understanding of CV disease, CV risk reduction, and recovery process will improve Outcome: Progressing Goal: Individualized Educational Video(s) Outcome: Progressing   Problem: Cardiovascular: Goal: Ability to achieve and maintain adequate cardiovascular perfusion will improve Outcome: Progressing   Problem: Health Behavior/Discharge Planning: Goal: Ability to safely manage health-related needs after discharge will improve Outcome: Progressing   Problem: Education: Goal: Knowledge of General Education information will improve Description: Including pain rating scale, medication(s)/side effects and non-pharmacologic comfort measures Outcome: Progressing   Problem: Health Behavior/Discharge Planning: Goal: Ability to manage health-related needs will improve Outcome: Progressing   Problem: Clinical Measurements: Goal: Ability to maintain clinical measurements within normal limits will improve Outcome: Progressing Goal: Will remain free from infection Outcome: Progressing Goal: Diagnostic test results will improve Outcome: Progressing Goal: Respiratory complications will improve Outcome: Progressing Goal: Cardiovascular complication will be avoided Outcome: Progressing   Problem: Activity: Goal: Risk for activity intolerance will decrease Outcome: Progressing   Problem:  Nutrition: Goal: Adequate nutrition will be maintained Outcome: Progressing   Problem: Coping: Goal: Level of anxiety will decrease Outcome: Progressing   Problem: Elimination: Goal: Will not experience complications related to bowel motility Outcome: Progressing Goal: Will not experience complications related to urinary retention Outcome: Progressing   Problem: Pain Managment: Goal: General experience of comfort will improve and/or be controlled Outcome: Progressing   Problem: Safety: Goal: Ability to remain free from injury will improve Outcome: Progressing   Problem: Skin Integrity: Goal: Risk for impaired skin integrity will decrease Outcome: Progressing
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
## 2023-10-12 NOTE — Progress Notes (Addendum)
  Progress Note  Patient Name: [name] Date of Encounter: 10/12/2023 [facility] Cardiologist: Jayson Sierras, MD   Interval Summary   Feeling well today.  No acute complaints.  No further chest pain.  Vital Signs Vitals:   10/12/23 0500 10/12/23 0742 10/12/23 0817 10/12/23 0832  BP: 138/72 131/73  135/80  Pulse: 67 61 66 68  Resp: 18 14 18 16   Temp: 98.2 F (36.8 C) 98 F (36.7 C)  (!) 97.5 F (36.4 C)  TempSrc: Oral Oral  Oral  SpO2: 98% 96%  97%  Weight:      Height:        Intake/Output Summary (Last 24 hours) at 10/12/2023 0931 Last data filed at 10/12/2023 0451 Gross per 24 hour  Intake 480 ml  Output 1050 ml  Net -570 ml      10/10/2023   12:53 AM 07/15/2023    2:14 PM 05/22/2023   10:00 AM  Last 3 Weights  Weight (lbs) 197 lb 5 oz 197 lb 197 lb  Weight (kg) 89.5 kg 89.359 kg 89.359 kg      Telemetry/ECG  Sinus rhythm- Personally Reviewed  Physical Exam  GEN: No acute distress.   Neck: No JVD Cardiac: RRR, no murmurs, rubs, or gallops.  Respiratory: Clear to auscultation bilaterally. GI: Soft, nontender, non-distended  MS: No edema  Assessment & Plan   1.  Unstable angina: Has preserved ejection fraction.  Severe native vessel disease and severe graft disease.  Left heart catheterization with subclavian stenosis.  Willing to be arranged for subclavian intervention as an outpatient.  2.  Hypertension: Continue home medications  3.  Hyperlipidemia: Continue statin  4.  Hyponatremia: Has been evaluated by nephrology.  Plan for further evaluation today and tomorrow.  Alycen Mack likely plan for discharge tomorrow.  5.  Acute diastolic heart failure: Patient had reported elevated volume.  No obvious volume overload currently.  Potentially related to ischemia.  Medical Readiness Date: 10/11/2023    For questions or updates, please contact [facility] Please consult [email or web address] for contact info under         Signed, Prisila Dlouhy  Gladis Norton, MD
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
## 2023-10-12 NOTE — Progress Notes (Signed)
 PHARMACY - ANTICOAGULATION CONSULT NOTE  Pharmacy Consult for heparin Indication: NSTEMI  Labs: Recent Labs    10/09/23 1540 10/09/23 1739 10/10/23 0008 10/10/23 0441 10/10/23 0800 10/11/23 0430 10/11/23 1246 10/11/23 2043 10/11/23 2223  HGB 14.7  --   --  14.0  --  14.8  --   --   --   HCT 42.5  --   --  39.5  --  41.4  --   --   --   PLT 93*  --   --  74*  --  66*  --   --   --   HEPARINUNFRC  --   --    < >  --    < > 0.26* 0.44 0.79* 0.45  CREATININE 0.80  --   --  0.79  --  0.77  --   --   --   TROPONINIHS 69* 91*  --   --   --   --   --   --   --    < > = values in this interval not displayed.  Assessment/Plan:  58yo male remains therapeutic on heparin. Will continue infusion at current rate of 1500 units/hr and monitor daily level.  Marvetta Dauphin, PharmD, BCPS 10/12/2023 12:07 AM

## 2023-10-13 DIAGNOSIS — I214 Non-ST elevation (NSTEMI) myocardial infarction: Secondary | ICD-10-CM | POA: Diagnosis not present

## 2023-10-13 LAB — BASIC METABOLIC PANEL WITH GFR
Anion gap: 13 (ref 5–15)
BUN: 10 mg/dL (ref 6–20)
CO2: 22 mmol/L (ref 22–32)
Calcium: 8.6 mg/dL — ABNORMAL LOW (ref 8.9–10.3)
Chloride: 95 mmol/L — ABNORMAL LOW (ref 98–111)
Creatinine, Ser: 0.8 mg/dL (ref 0.61–1.24)
GFR, Estimated: >60 mL/min (ref >60)
Glucose, Bld: 152 mg/dL — ABNORMAL HIGH (ref 70–99)
Potassium: 3.7 mmol/L (ref 3.5–5.1)
Sodium: 130 mmol/L — ABNORMAL LOW (ref 135–145)

## 2023-10-13 LAB — CBC
HCT: 40.2 % (ref 39.0–52.0)
Hemoglobin: 14 g/dL (ref 13.0–17.0)
MCH: 33.8 pg (ref 26.0–34.0)
MCHC: 34.8 g/dL (ref 30.0–36.0)
MCV: 97.1 fL (ref 80.0–100.0)
Platelets: 76 K/uL — ABNORMAL LOW (ref 150–400)
RBC: 4.14 MIL/uL — ABNORMAL LOW (ref 4.22–5.81)
RDW: 12.9 % (ref 11.5–15.5)
WBC: 3.7 K/uL — ABNORMAL LOW (ref 4.0–10.5)
nRBC: 0 % (ref 0.0–0.2)

## 2023-10-13 LAB — ACTH STIMULATION, 3 TIME POINTS
Cortisol, 30 Min: 22.3 ug/dL
Cortisol, 60 Min: 25.5 ug/dL
Cortisol, Base: 11.5 ug/dL

## 2023-10-13 NOTE — Plan of Care (Signed)
  Problem: Activity: Goal: Risk for activity intolerance will decrease Outcome: Progressing   Problem: Nutrition: Goal: Adequate nutrition will be maintained Outcome: Progressing   Problem: Elimination: Goal: Will not experience complications related to bowel motility Outcome: Progressing Goal: Will not experience complications related to urinary retention Outcome: Progressing   Problem: Pain Managment: Goal: General experience of comfort will improve and/or be controlled Outcome: Progressing   Problem: Safety: Goal: Ability to remain free from injury will improve Outcome: Progressing

## 2023-10-13 NOTE — Progress Notes (Signed)
 ACTH lab not drawn as scheduled. Phlebotomist Shandronicka stated lab will be collected after 0700 due to short staffing in the phlebotomy dept. Dr. Norine and charge nurse notified. Will continue to monitor and follow up for lab collection.

## 2023-10-13 NOTE — Progress Notes (Signed)
 Patient seen and examined.  Planning workup for hyponatremia by nephrology.  Once workup concluded, we [name] plan for discharge.  Soyla Norton, MD
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
## 2023-10-14 ENCOUNTER — Other Ambulatory Visit: Payer: Self-pay | Admitting: Cardiology

## 2023-10-21 ENCOUNTER — Telehealth: Payer: Self-pay | Admitting: *Deleted

## 2023-10-21 NOTE — Telephone Encounter (Signed)
 Left a message for the patient to call back about his appointment with Dr. Darron tomorrow morning at 8 am at the [facility] office.

## 2023-10-21 NOTE — Telephone Encounter (Signed)
 Patient's appointment has been moved to the 9:20 slot tomorrow, 10/20, per his request.

## 2023-10-22 ENCOUNTER — Ambulatory Visit: Attending: Cardiovascular Disease | Admitting: Cardiovascular Disease

## 2023-10-22 ENCOUNTER — Encounter: Payer: Self-pay | Admitting: Cardiovascular Disease

## 2023-10-22 VITALS — BP 116/70 | HR 87 | Ht 73.0 in | Wt 195.0 lb

## 2023-10-22 DIAGNOSIS — I25118 Atherosclerotic heart disease of native coronary artery with other forms of angina pectoris: Secondary | ICD-10-CM | POA: Diagnosis not present

## 2023-10-22 DIAGNOSIS — Z720 Tobacco use: Secondary | ICD-10-CM

## 2023-10-22 DIAGNOSIS — E785 Hyperlipidemia, unspecified: Secondary | ICD-10-CM | POA: Diagnosis not present

## 2023-10-22 DIAGNOSIS — I739 Peripheral vascular disease, unspecified: Secondary | ICD-10-CM

## 2023-10-22 DIAGNOSIS — I771 Stricture of artery: Secondary | ICD-10-CM
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
## 2023-10-22 NOTE — Patient Instructions (Signed)
 Medication Instructions:  No changes *If you need a refill on your cardiac medications before your next appointment, please call your pharmacy*  Lab Work: None ordered If you have labs (blood work) drawn today and your tests are completely normal, you will receive your results only by: MyChart Message (if you have MyChart) OR A paper copy in the mail If you have any lab test that is abnormal or we need to change your treatment, we will call you to review the results.  Follow-Up: At [facility], you and your health needs are our priority.  As part of our continuing mission to provide you with exceptional heart care, our providers are all part of one team.  This team includes your primary Cardiologist (physician) and Advanced Practice Providers or APPs (Physician Assistants and Nurse Practitioners) who all work together to provide you with the care you need, when you need it.  Your next appointment:   Keep your post procedure follow up on 11/18 at 3:35.  We recommend signing up for the patient portal called MyChart.  Sign up information is provided on this After Visit Summary.  MyChart is used to connect with patients for Virtual Visits (Telemedicine).  Patients are able to view lab/test results, encounter notes, upcoming appointments, etc.  Non-urgent messages can be sent to your provider as well.   To learn more about what you can do with MyChart, go to [email or web address].   Other Instructions  [facility] A DEPT OF [facility] [address] A DEPT OF THE [place] [address]  [place] [address] Dept: [phone] Loc: [phone]  [name]  10/22/2023  You are scheduled for a Peripheral Angiogram on Wednesday, October 29 with Dr. Deatrice Cage.  1. Please arrive at the [facility] (Main Entrance A) at [facility]: [address] [place], [place] [address] at 10:30 AM (This time is 2 hour(s)  before your procedure to ensure your preparation).   Free valet parking service is available. You will check in at ADMITTING. The support person will be asked to wait in the waiting room.  It is OK to have someone drop you off and come back when you are ready to be discharged.    Special note: Every effort is made to have your procedure done on time. Please understand that emergencies sometimes delay scheduled procedures.  2. Diet: Nothing to eat after midnight.   3. Hydration: You need to be well hydrated before your procedure. On October 29, you may drink approved liquids (see below) until 2 hours before the procedure, with 16 oz of water as your last intake.   List of approved liquids water, clear juice, clear tea, black coffee, fruit juices, non-citric and without pulp, carbonated beverages, Gatorade, Kool -Aid, plain Jello-O and plain ice popsicles.  4. Labs: Already completed  5. Medication instructions in preparation for your procedure: Noting to hold  On the morning of your procedure, take your Aspirin 81 mg and Plavix /Clopidogrel  and any morning medicines NOT listed above.  You may use sips of water.  6. Plan to go home the same day, you will only stay overnight if medically necessary. 7. Bring a current list of your medications and current insurance cards. 8. You MUST have a responsible person to drive you home. 9. Someone MUST be with you the first 24 hours after you arrive home or your discharge will be delayed. 10. Please wear clothes  that are easy to get on and off and wear slip-on shoes.  Thank you for allowing us  to care for you!   -- [facility]

## 2023-10-29 ENCOUNTER — Telehealth: Payer: Self-pay | Admitting: *Deleted

## 2023-10-29 NOTE — Telephone Encounter (Signed)
 Call to patient to review instructions, no answer, voicemail message.

## 2023-10-29 NOTE — Telephone Encounter (Addendum)
 Vertebral Angiogram scheduled at [facility] for: Wednesday October 30, 2023 11 AM-this is a time change per Cath Lab schedule Arrival time [facility] Main Entrance A at: 9 AM  Diet: -Nothing to eat after midnight.  Hydration: -May drink clear liquids until 2 hours before the procedure.  Approved liquids: Water, clear tea, black coffee, fruit juices-non-citric and without pulp,Gatorade, plain Jello/popsicles.   -Please drink 16 oz of water 2 hours before procedure.  Medication instructions: -Usual morning medications can be taken including aspirin 81 mg and Plavix  75 mg  Plan to go home the same day, you will only stay overnight if medically necessary.  You must have responsible adult to drive you home  Someone must be with you the first 24 hours after you arrive home.  Reviewed procedure instructions with patient.

## 2023-10-29 NOTE — Telephone Encounter (Signed)
 Reviewed procedure instructions with patient.

## 2023-10-30 ENCOUNTER — Encounter (HOSPITAL_COMMUNITY): Admission: RE | Disposition: A | Payer: Self-pay | Source: Home / Self Care | Attending: Cardiovascular Disease

## 2023-10-30 ENCOUNTER — Other Ambulatory Visit: Payer: Self-pay | Admitting: *Deleted

## 2023-10-30 ENCOUNTER — Other Ambulatory Visit: Payer: Self-pay

## 2023-10-30 ENCOUNTER — Ambulatory Visit (HOSPITAL_COMMUNITY)
Admission: RE | Admit: 2023-10-30 | Discharge: 2023-10-31 | Disposition: A | Discharge status: Home / Self Care | Attending: Cardiovascular Disease | Admitting: Cardiovascular Disease

## 2023-10-30 ENCOUNTER — Encounter (HOSPITAL_COMMUNITY): Payer: Self-pay | Admitting: Cardiovascular Disease

## 2023-10-30 DIAGNOSIS — Z79899 Other long term (current) drug therapy: Secondary | ICD-10-CM | POA: Insufficient documentation

## 2023-10-30 DIAGNOSIS — Z89611 Acquired absence of right leg above knee: Secondary | ICD-10-CM | POA: Diagnosis not present

## 2023-10-30 DIAGNOSIS — I25112 Atherosclerotic heart disease of native coronary artery with refractory angina pectoris: Secondary | ICD-10-CM | POA: Insufficient documentation

## 2023-10-30 DIAGNOSIS — Z951 Presence of aortocoronary bypass graft: Secondary | ICD-10-CM | POA: Diagnosis not present

## 2023-10-30 DIAGNOSIS — I252 Old myocardial infarction: Secondary | ICD-10-CM | POA: Insufficient documentation

## 2023-10-30 DIAGNOSIS — I779 Disorder of arteries and arterioles, unspecified: Secondary | ICD-10-CM | POA: Insufficient documentation

## 2023-10-30 DIAGNOSIS — Z7902 Long term (current) use of antithrombotics/antiplatelets: Secondary | ICD-10-CM | POA: Diagnosis not present

## 2023-10-30 DIAGNOSIS — E785 Hyperlipidemia, unspecified: Secondary | ICD-10-CM | POA: Insufficient documentation

## 2023-10-30 DIAGNOSIS — F17210 Nicotine dependence, cigarettes, uncomplicated: Secondary | ICD-10-CM | POA: Insufficient documentation

## 2023-10-30 DIAGNOSIS — I70201 Unspecified atherosclerosis of native arteries of extremities, right leg: Secondary | ICD-10-CM | POA: Insufficient documentation

## 2023-10-30 DIAGNOSIS — I771 Stricture of artery: Secondary | ICD-10-CM | POA: Diagnosis present

## 2023-10-30 DIAGNOSIS — I10 Essential (primary) hypertension: Secondary | ICD-10-CM | POA: Insufficient documentation

## 2023-10-30 DIAGNOSIS — Z955 Presence of coronary angioplasty implant and graft: Secondary | ICD-10-CM | POA: Diagnosis not present

## 2023-10-30 DIAGNOSIS — Z7982 Long term (current) use of aspirin: Secondary | ICD-10-CM | POA: Diagnosis not present

## 2023-10-30 DIAGNOSIS — I708 Atherosclerosis of other arteries: Secondary | ICD-10-CM | POA: Diagnosis not present

## 2023-10-30 HISTORY — PX: UPPER EXTREMITY INTERVENTION: CATH118271

## 2023-10-30 HISTORY — PX: UPPER EXTREMITY ANGIOGRAPHY: CATH118270

## 2023-10-30 HISTORY — PX: VERTEBRAL ANGIOGRAPHY: CATH118275

## 2023-10-30 LAB — CBC
HCT: 39.3 % (ref 39.0–52.0)
Hemoglobin: 13.5 g/dL (ref 13.0–17.0)
MCH: 33.5 pg (ref 26.0–34.0)
MCHC: 34.4 g/dL (ref 30.0–36.0)
MCV: 97.5 fL (ref 80.0–100.0)
Platelets: 102 K/uL — ABNORMAL LOW (ref 150–400)
RBC: 4.03 MIL/uL — ABNORMAL LOW (ref 4.22–5.81)
RDW: 13.4 % (ref 11.5–15.5)
WBC: 4.2 K/uL (ref 4.0–10.5)
nRBC: 0 % (ref 0.0–0.2)

## 2023-10-30 LAB — POCT ACTIVATED CLOTTING TIME: Activated Clotting Time: 262 s

## 2023-10-30 LAB — CREATININE, SERUM
Creatinine, Ser: 0.75 mg/dL (ref 0.61–1.24)
GFR, Estimated: >60 mL/min (ref >60)

## 2023-10-30 SURGERY — VERTEBRAL ANGIOGRAPHY
Anesthesia: LOCAL

## 2023-10-30 MED ORDER — ISOSORBIDE MONONITRATE ER 60 MG PO TB24
60.0000 mg | ORAL_TABLET | Freq: Every day | ORAL | Status: DC
Start: 2023-10-30 — End: 2023-10-31
  Administered 2023-10-30 – 2023-10-31 (×2): 60 mg via ORAL
  Filled 2023-10-30 (×2): qty 1

## 2023-10-30 MED ORDER — SODIUM CHLORIDE 0.9% FLUSH: 3.0000 mL | INTRAVENOUS | Status: DC | PRN

## 2023-10-30 MED ORDER — MIDAZOLAM HCL (PF) 2 MG/2ML IJ SOLN
INTRAMUSCULAR | Status: DC | PRN
  Administered 2023-10-30: 1 mg via INTRAVENOUS

## 2023-10-30 MED ORDER — CLOPIDOGREL BISULFATE 75 MG PO TABS
75.0000 mg | ORAL_TABLET | Freq: Every day | ORAL | Status: DC
  Administered 2023-10-31: 75 mg via ORAL
  Filled 2023-10-30: qty 1

## 2023-10-30 MED ORDER — SODIUM CHLORIDE 0.9 % IV SOLN: 250.0000 mL | INTRAVENOUS | Status: DC | PRN

## 2023-10-30 MED ORDER — FENTANYL CITRATE (PF) 100 MCG/2ML IJ SOLN
INTRAMUSCULAR | Status: DC | PRN
  Administered 2023-10-30: 50 ug via INTRAVENOUS

## 2023-10-30 MED ORDER — SODIUM CHLORIDE 0.9% FLUSH: 3.0000 mL | Freq: Two times a day (BID) | INTRAVENOUS | Status: DC

## 2023-10-30 MED ORDER — CLOPIDOGREL BISULFATE 300 MG PO TABS
ORAL_TABLET | ORAL | Status: AC
Start: 2023-10-30 — End: 2023-10-30
  Filled 2023-10-30: qty 1

## 2023-10-30 MED ORDER — LIDOCAINE HCL (PF) 1 % IJ SOLN
INTRAMUSCULAR | Status: DC | PRN
  Administered 2023-10-30: 15 mL

## 2023-10-30 MED ORDER — ACETAMINOPHEN 325 MG PO TABS: 650.0000 mg | ORAL_TABLET | ORAL | Status: DC | PRN

## 2023-10-30 MED ORDER — MIDAZOLAM HCL 2 MG/2ML IJ SOLN
INTRAMUSCULAR | Status: AC
  Filled 2023-10-30: qty 2

## 2023-10-30 MED ORDER — HEPARIN (PORCINE) IN NACL 2000-0.9 UNIT/L-% IV SOLN
INTRAVENOUS | Status: DC | PRN
  Administered 2023-10-30: 1000 mL

## 2023-10-30 MED ORDER — ESCITALOPRAM OXALATE 10 MG PO TABS
10.0000 mg | ORAL_TABLET | Freq: Every day | ORAL | Status: DC
  Administered 2023-10-30 – 2023-10-31 (×2): 10 mg via ORAL
  Filled 2023-10-30 (×2): qty 1

## 2023-10-30 MED ORDER — SODIUM CHLORIDE 0.9 % IV SOLN
250.0000 mL | INTRAVENOUS | Status: DC | PRN
Start: 2023-10-30 — End: 2023-10-31

## 2023-10-30 MED ORDER — FREE WATER: 500.0000 mL | Freq: Once | Status: DC

## 2023-10-30 MED ORDER — NITROGLYCERIN 0.4 MG SL SUBL: 0.4000 mg | SUBLINGUAL_TABLET | SUBLINGUAL | Status: DC | PRN

## 2023-10-30 MED ORDER — ALBUTEROL SULFATE (2.5 MG/3ML) 0.083% IN NEBU: 2.5000 mg | INHALATION_SOLUTION | RESPIRATORY_TRACT | Status: DC | PRN

## 2023-10-30 MED ORDER — RAMIPRIL 5 MG PO CAPS
10.0000 mg | ORAL_CAPSULE | Freq: Every day | ORAL | Status: DC
  Administered 2023-10-30 – 2023-10-31 (×2): 10 mg via ORAL
  Filled 2023-10-30 (×2): qty 2

## 2023-10-30 MED ORDER — SODIUM CHLORIDE 0.9 % IV SOLN
INTRAVENOUS | Status: AC
Start: 2023-10-30 — End: 2023-10-30

## 2023-10-30 MED ORDER — BENZOYL PEROXIDE 10 % EX LIQD: 1.0000 | CUTANEOUS | Status: DC

## 2023-10-30 MED ORDER — LIDOCAINE HCL (PF) 1 % IJ SOLN
INTRAMUSCULAR | Status: AC
  Filled 2023-10-30: qty 30

## 2023-10-30 MED ORDER — ENOXAPARIN SODIUM 40 MG/0.4ML IJ SOSY
40.0000 mg | PREFILLED_SYRINGE | INTRAMUSCULAR | Status: DC
  Administered 2023-10-31: 40 mg via SUBCUTANEOUS
  Filled 2023-10-30: qty 0.4

## 2023-10-30 MED ORDER — DOXYCYCLINE HYCLATE 100 MG PO TABS
100.0000 mg | ORAL_TABLET | Freq: Two times a day (BID) | ORAL | Status: DC
  Administered 2023-10-30 – 2023-10-31 (×3): 100 mg via ORAL
  Filled 2023-10-30 (×3): qty 1

## 2023-10-30 MED ORDER — METOPROLOL SUCCINATE ER 25 MG PO TB24
25.0000 mg | ORAL_TABLET | Freq: Every day | ORAL | Status: DC
Start: 2023-10-30 — End: 2023-10-31
  Administered 2023-10-30 – 2023-10-31 (×2): 25 mg via ORAL
  Filled 2023-10-30 (×2): qty 1

## 2023-10-30 MED ORDER — ATORVASTATIN CALCIUM 80 MG PO TABS
80.0000 mg | ORAL_TABLET | Freq: Every day | ORAL | Status: DC
Start: 2023-10-30 — End: 2023-10-31
  Administered 2023-10-30 – 2023-10-31 (×2): 80 mg via ORAL
  Filled 2023-10-30 (×2): qty 1

## 2023-10-30 MED ORDER — HEPARIN SODIUM (PORCINE) 1000 UNIT/ML IJ SOLN
INTRAMUSCULAR | Status: AC
  Filled 2023-10-30: qty 10

## 2023-10-30 MED ORDER — FENTANYL CITRATE (PF) 100 MCG/2ML IJ SOLN
INTRAMUSCULAR | Status: AC
  Filled 2023-10-30: qty 2

## 2023-10-30 MED ORDER — ASPIRIN 81 MG PO CHEW: 81.0000 mg | CHEWABLE_TABLET | ORAL | Status: DC

## 2023-10-30 MED ORDER — IODIXANOL 320 MG/ML IV SOLN
INTRAVENOUS | Status: DC | PRN
  Administered 2023-10-30: 110 mL

## 2023-10-30 MED ORDER — AMLODIPINE BESYLATE 5 MG PO TABS
5.0000 mg | ORAL_TABLET | Freq: Every day | ORAL | Status: DC
Start: 2023-10-30 — End: 2023-10-31
  Administered 2023-10-30 – 2023-10-31 (×2): 5 mg via ORAL
  Filled 2023-10-30 (×2): qty 1

## 2023-10-30 MED ORDER — HEPARIN SODIUM (PORCINE) 1000 UNIT/ML IJ SOLN
INTRAMUSCULAR | Status: DC | PRN
  Administered 2023-10-30: 4000 [IU] via INTRAVENOUS
  Administered 2023-10-30 (×2): 2000 [IU] via INTRAVENOUS

## 2023-10-30 MED ORDER — SODIUM CHLORIDE 0.9% FLUSH
3.0000 mL | Freq: Two times a day (BID) | INTRAVENOUS | Status: DC
  Administered 2023-10-30 – 2023-10-31 (×2): 3 mL via INTRAVENOUS

## 2023-10-30 MED ORDER — HYDROCODONE-ACETAMINOPHEN 7.5-325 MG PO TABS
1.0000 | ORAL_TABLET | Freq: Three times a day (TID) | ORAL | Status: DC
  Administered 2023-10-30 – 2023-10-31 (×3): 1 via ORAL
  Filled 2023-10-30 (×3): qty 1

## 2023-10-30 MED ORDER — CLOPIDOGREL BISULFATE 300 MG PO TABS
ORAL_TABLET | ORAL | Status: DC | PRN
  Administered 2023-10-30: 300 mg via ORAL

## 2023-10-30 MED ORDER — GABAPENTIN 400 MG PO CAPS
800.0000 mg | ORAL_CAPSULE | Freq: Three times a day (TID) | ORAL | Status: DC
  Administered 2023-10-30 – 2023-10-31 (×3): 800 mg via ORAL
  Filled 2023-10-30 (×3): qty 2

## 2023-10-30 MED ORDER — VITAMIN D 25 MCG (1000 UNIT) PO TABS
1000.0000 [IU] | ORAL_TABLET | Freq: Every day | ORAL | Status: DC
  Administered 2023-10-30 – 2023-10-31 (×2): 1000 [IU] via ORAL
  Filled 2023-10-30 (×2): qty 1

## 2023-10-30 MED ORDER — ONDANSETRON HCL 4 MG/2ML IJ SOLN: 4.0000 mg | Freq: Four times a day (QID) | INTRAMUSCULAR | Status: DC | PRN

## 2023-10-30 MED ORDER — ASPIRIN 81 MG PO TBEC
81.0000 mg | DELAYED_RELEASE_TABLET | Freq: Every day | ORAL | Status: DC
Start: 2023-10-31 — End: 2023-10-31
  Administered 2023-10-31: 81 mg via ORAL
  Filled 2023-10-30: qty 1

## 2023-10-30 SURGICAL SUPPLY — 29 items
BAG SNAP BAND KOVER 36X36 (MISCELLANEOUS) IMPLANT
BALLOON MUSTANG 4X20X135 (BALLOONS) IMPLANT
BALLOON MUSTANG 6.0X40 135 (BALLOONS) IMPLANT
CATH ANGIO 5F PIGTAIL 100CM (CATHETERS) IMPLANT
CATH INFINITI JR4 5F (CATHETERS) IMPLANT
CATH INFINITI VERT 5FR 125CM (CATHETERS) IMPLANT
CATH NAVICROSS ANGLED 135CM (MICROCATHETER) IMPLANT
CATH RUBICON 018 135 (CATHETERS) IMPLANT
CLOSURE PERCLOSE PROSTYLE (Vascular Products) IMPLANT
COVER DOME SNAP 22 D (MISCELLANEOUS) IMPLANT
KIT ENCORE 26 ADVANTAGE (KITS) IMPLANT
KIT ESSENTIALS PG (KITS) IMPLANT
KIT MICROPUNCTURE NIT STIFF (SHEATH) IMPLANT
KIT PV (KITS) ×3 IMPLANT
KIT SINGLE USE MANIFOLD (KITS) IMPLANT
SET ATX-X65L (MISCELLANEOUS) IMPLANT
SHEATH CATAPULT 7F 90 MP (SHEATH) IMPLANT
SHEATH PINNACLE 5F 10CM (SHEATH) IMPLANT
SHEATH PINNACLE 7F 10CM (SHEATH) IMPLANT
SHEATH PROBE COVER 6X72 (BAG) IMPLANT
STENT VIABAHN 29XCATH135 (Permanent Stent) IMPLANT
SYR MEDRAD MARK 7 150ML (SYRINGE) ×3 IMPLANT
TRANSDUCER W/STOPCOCK (MISCELLANEOUS) ×3 IMPLANT
TRAY PV CATH (CUSTOM PROCEDURE TRAY) ×3 IMPLANT
TUBING CIL FLEX 10 FLL-RA (TUBING) IMPLANT
WIRE G V18X300CM (WIRE) IMPLANT
WIRE HITORQ VERSACORE ST 145CM (WIRE) IMPLANT
WIRE MICROINTRODUCER 60CM (WIRE) IMPLANT
WIRE VERSACORE LOC 115CM (WIRE) IMPLANT

## 2023-10-30 NOTE — Interval H&P Note (Signed)
 History and Physical Interval Note:  10/30/2023 12:23 PM  [name]  has presented today for surgery, with the diagnosis of left subclavian stenosis.  The various methods of treatment have been discussed with the patient and family. After consideration of risks, benefits and other options for treatment, the patient has consented to  Procedure(s): VERTEBRAL ANGIOGRAPHY (N/A) Upper Extremity Angiography (N/A) as a surgical intervention.  The patient's history has been reviewed, patient examined, no change in status, stable for surgery.  I have reviewed the patient's chart and labs.  Questions were answered to the patient's satisfaction.     Wynelle Dreier

## 2023-10-31 ENCOUNTER — Encounter (HOSPITAL_COMMUNITY): Payer: Self-pay | Admitting: Cardiovascular Disease

## 2023-10-31 DIAGNOSIS — I708 Atherosclerosis of other arteries: Secondary | ICD-10-CM | POA: Diagnosis not present

## 2023-10-31 DIAGNOSIS — I771 Stricture of artery: Secondary | ICD-10-CM | POA: Diagnosis not present

## 2023-10-31 LAB — LIPID PANEL
Cholesterol: 82 mg/dL (ref 0–200)
HDL: 33 mg/dL — ABNORMAL LOW (ref >40)
LDL Cholesterol: 42 mg/dL (ref 0–99)
Total CHOL/HDL Ratio: 2.5 ratio
Triglycerides: 35 mg/dL (ref <150)
VLDL: 7 mg/dL (ref 0–40)

## 2023-10-31 NOTE — Progress Notes (Signed)
 DISCHARGE NOTE HOME [facility] to be discharged Home per MD order. Floor RN Discussed prescriptions and follow up appointments with the patient. Prescriptions given to patient; medication list explained in detail. Patient verbalized understanding.  Skin clean, dry and intact without evidence of skin break down, no evidence of skin tears noted. IV catheter discontinued intact. Site without signs and symptoms of complications. Dressing and pressure applied. Pt denies pain at the site currently. No complaints noted.  Patient free of lines, drains, and wounds.   An After Visit Summary (AVS) was printed and given to the patient. Patient escorted via wheelchair, and discharged home via private auto.  Peyton SHAUNNA Pepper, RN
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
## 2023-10-31 NOTE — Progress Notes (Signed)
 Rounding Note   Patient Name: [name] Date of Encounter: 10/31/2023  [facility] Cardiologist: Jayson Sierras, MD  [facility] Cardiologist: Darron  Subjective  No complaints. No events.   Scheduled Meds:  amLODipine   5 mg Oral Daily   aspirin EC  81 mg Oral Daily   atorvastatin   80 mg Oral Daily   cholecalciferol  1,000 Units Oral Daily   clopidogrel   75 mg Oral Daily   doxycycline  100 mg Oral BID   enoxaparin (LOVENOX) injection  40 mg Subcutaneous Q24H   escitalopram  10 mg Oral Daily   gabapentin  800 mg Oral TID   HYDROcodone -acetaminophen   1 tablet Oral Q8H   isosorbide  mononitrate  60 mg Oral Daily   metoprolol  succinate  25 mg Oral Daily   ramipril   10 mg Oral Daily   sodium chloride  flush  3 mL Intravenous Q12H   Continuous Infusions:  sodium chloride      PRN Meds: sodium chloride , acetaminophen , albuterol, nitroGLYCERIN , ondansetron  (ZOFRAN ) IV, sodium chloride  flush   Vital Signs  Vitals:   10/30/23 1941 10/30/23 2300 10/31/23 0100 10/31/23 0300  BP: (!) 99/57 138/64 111/65 113/66  Pulse: 64 63 60 62  Resp: 20 12 18 18   Temp: (!) 97.5 F (36.4 C) 97.8 F (36.6 C)  97.8 F (36.6 C)  TempSrc: Oral Oral  Oral  SpO2: 100% 100% 100% 99%  Weight:      Height:        Intake/Output Summary (Last 24 hours) at 10/31/2023 0834 Last data filed at 10/31/2023 0351 Gross per 24 hour  Intake 617.33 ml  Output 1725 ml  Net -1107.67 ml      10/30/2023   10:03 AM 10/22/2023    9:12 AM 10/10/2023   12:53 AM  Last 3 Weights  Weight (lbs) 169 lb 1.5 oz 195 lb 197 lb 5 oz  Weight (kg) 76.7 kg 88.451 kg 89.5 kg      Telemetry Sinus - Personally Reviewed  ECG  NO tracing   Physical Exam  GEN: No acute distress.   Neck: No JVD Cardiac: RRR, no murmurs, rubs, or gallops.  Respiratory: Clear to auscultation bilaterally. GI: Soft, nontender, non-distended  MS: No left LE edema; Right AKA.   Neuro:  Nonfocal  Psych: Normal affect    Labs High Sensitivity Troponin:   Recent Labs  Lab 10/09/23 1540 10/09/23 1739  TROPONINIHS 69* 91*     Chemistry Recent Labs  Lab 10/30/23 1527  CREATININE 0.75  GFRNONAA >60    Lipids  Recent Labs  Lab 10/31/23 0314  CHOL 82  TRIG 35  HDL 33*  LDLCALC 42  CHOLHDL 2.5    Hematology Recent Labs  Lab 10/30/23 1527  WBC 4.2  RBC 4.03*  HGB 13.5  HCT 39.3  MCV 97.5  MCH 33.5  MCHC 34.4  RDW 13.4  PLT 102*   Thyroid No results for input(s): TSH, FREET4 in the last 168 hours.  BNPNo results for input(s): BNP, PROBNP in the last 168 hours.  DDimer No results for input(s): DDIMER in the last 168 hours.   Radiology  PERIPHERAL VASCULAR CATHETERIZATION Result Date: 10/30/2023 1.  Moderate right subclavian artery stenosis and evidence of heavily calcified subtotal occlusion of the left subclavian artery. 2.  Successful angioplasty and covered stent Placement to the Left Subclavian Artery.  Recommendations: Dual antiplatelet therapy for at least 6 months. Will monitor overnight to see how he responds clinically.  Suspect that he  will have significant improvement in angina now that the LIMA has normal flow.  Otherwise, we might have to think about treating his SVG to OM anastomosis.   Patient Profile   58 y.o. male with CAD s/p 4V CABG, HLD, HTN, carotid artery disease, tobacco use, PAD with recent NSTEMI. Cardiac cath with patent LIMA to LAD with flow affected by severe left subclavian artery stenosis. Patent SVG to OM, patent but heavily diseased SVG to Diagonal and occluded SVG to RCA.   Assessment & Plan   PAD/Left subclavian artery stenosis: He is s/p angioplasty and covered stent placement in the subtotally occluded left subclavian artery. He is doing well. Will plan DAPT with ASA and Plavix  for at least six months. Continue statin.   Discharge home today   For questions or updates, please contact [facility] Please consult [email or web address] for  contact info under    Signed, Lonni Cash, MD  10/31/2023, 8:34 AM

## 2023-11-13 ENCOUNTER — Ambulatory Visit (HOSPITAL_COMMUNITY)
Admission: RE | Admit: 2023-11-13 | Discharge: 2023-11-13 | Disposition: A | Discharge status: Home / Self Care | Source: Ambulatory Visit | Attending: Cardiovascular Disease | Admitting: Cardiovascular Disease

## 2023-11-13 DIAGNOSIS — I771 Stricture of artery: Secondary | ICD-10-CM | POA: Diagnosis present

## 2023-11-19 ENCOUNTER — Ambulatory Visit: Attending: Physician Assistant | Admitting: Physician Assistant

## 2023-11-19 ENCOUNTER — Ambulatory Visit (HOSPITAL_COMMUNITY)

## 2023-11-19 VITALS — BP 125/78 | Ht 72.0 in | Wt 195.0 lb

## 2023-11-19 DIAGNOSIS — R079 Chest pain, unspecified: Secondary | ICD-10-CM

## 2023-11-19 DIAGNOSIS — Z720 Tobacco use: Secondary | ICD-10-CM

## 2023-11-19 DIAGNOSIS — I25709 Atherosclerosis of coronary artery bypass graft(s), unspecified, with unspecified angina pectoris: Secondary | ICD-10-CM | POA: Diagnosis not present

## 2023-11-19 DIAGNOSIS — I10 Essential (primary) hypertension: Secondary | ICD-10-CM

## 2023-11-19 DIAGNOSIS — E785 Hyperlipidemia, unspecified: Secondary | ICD-10-CM

## 2023-11-19 DIAGNOSIS — I771 Stricture of artery: Secondary | ICD-10-CM | POA: Diagnosis not present

## 2023-11-19 MED ORDER — ISOSORBIDE MONONITRATE ER 60 MG PO TB24: 90.0000 mg | ORAL_TABLET | Freq: Every day | ORAL | 2 refills | Status: AC
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
## 2023-11-19 NOTE — Patient Instructions (Signed)
 Medication Instructions:   START TAKING :   IMDUR   90 MG ONCE A DAY     *If you need a refill on your cardiac medications before your next appointment, please call your pharmacy*   Lab Work: NONE ORDERED  TODAY    If you have labs (blood work) drawn today and your tests are completely normal, you will receive your results only by: MyChart Message (if you have MyChart) OR A paper copy in the mail If you have any lab test that is abnormal or we need to change your treatment, we will call you to review the results.  Testing/Procedures:  NONE ORDERED  TODAY    Follow-Up: At [facility], you and your health needs are our priority.  As part of our continuing mission to provide you with exceptional heart care, our providers are all part of one team.  This team includes your primary Cardiologist (physician) and Advanced Practice Providers or APPs (Physician Assistants and Nurse Practitioners) who all work together to provide you with the care you need, when you need it.  Your next appointment:  MOVE EDEN APPOINTMENT OUT  2- 3 WEEKS  FROM  ORIGINAL APPOINTMENT  WITH PAC       We recommend signing up for the patient portal called MyChart.  Sign up information is provided on this After Visit Summary.  MyChart is used to connect with patients for Virtual Visits (Telemedicine).  Patients are able to view lab/test results, encounter notes, upcoming appointments, etc.  Non-urgent messages can be sent to your provider as well.   To learn more about what you can do with MyChart, go to [email or web address].   Other Instructions

## 2023-11-21 ENCOUNTER — Ambulatory Visit: Admitting: Nurse Practitioner

## 2023-11-21 NOTE — Progress Notes (Signed)
 Reassess chest pain in 1-2 weeks
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
## 2023-11-22 ENCOUNTER — Ambulatory Visit: Payer: Self-pay | Admitting: Cardiovascular Disease

## 2024-01-03 ENCOUNTER — Telehealth: Payer: Self-pay | Admitting: Cardiovascular Disease

## 2024-01-03 NOTE — Telephone Encounter (Signed)
" °*  STAT* If patient is at the pharmacy, call can be transferred to refill team.   1. Which medications need to be refilled? (please list name of each medication and dose if known)   nitroGLYCERIN  (NITROSTAT ) 0.4 MG SL tablet   2. Would you like to learn more about the convenience, safety, & potential cost savings by using the [facility]?   3. Are you open to using the [facility] (Type [facility]. ).  4. Which pharmacy/location (including street and city if local pharmacy) is medication to be sent to?  [facility] [phone] - [place], [place] - [address] AT [facility] [address]   5. Do they need a 30 day or 90 day supply?   Wife (Jewel) stated patient has 12 tablets left.   Patient has appointment scheduled with ESABRA Crate on 1//13. "

## 2024-01-07 ENCOUNTER — Ambulatory Visit: Admitting: Nurse Practitioner

## 2024-01-08 MED ORDER — NITROGLYCERIN 0.4 MG SL SUBL: 0.4000 mg | SUBLINGUAL_TABLET | SUBLINGUAL | 3 refills | Status: AC | PRN

## 2024-01-08 NOTE — Telephone Encounter (Signed)
"  Refill sent  "

## 2024-01-11 ENCOUNTER — Other Ambulatory Visit: Payer: Self-pay | Admitting: Cardiology

## 2024-01-14 ENCOUNTER — Ambulatory Visit: Admitting: Nurse Practitioner
